# Patient Record
Sex: Male | Born: 1935
Health system: Southern US, Community
[De-identification: ages and names within clinical notes are randomized; demographics above are authoritative.]

## PROBLEM LIST (undated history)

## (undated) DIAGNOSIS — R0600 Dyspnea, unspecified: Secondary | ICD-10-CM

## (undated) DIAGNOSIS — T8859XA Other complications of anesthesia, initial encounter: Secondary | ICD-10-CM

## (undated) DIAGNOSIS — Z7901 Long term (current) use of anticoagulants: Secondary | ICD-10-CM

## (undated) DIAGNOSIS — T4145XA Adverse effect of unspecified anesthetic, initial encounter: Secondary | ICD-10-CM

## (undated) DIAGNOSIS — I4891 Unspecified atrial fibrillation: Secondary | ICD-10-CM

## (undated) DIAGNOSIS — I2510 Atherosclerotic heart disease of native coronary artery without angina pectoris: Secondary | ICD-10-CM

## (undated) DIAGNOSIS — N400 Enlarged prostate without lower urinary tract symptoms: Secondary | ICD-10-CM

## (undated) DIAGNOSIS — I10 Essential (primary) hypertension: Secondary | ICD-10-CM

## (undated) DIAGNOSIS — I499 Cardiac arrhythmia, unspecified: Secondary | ICD-10-CM

## (undated) DIAGNOSIS — M1990 Unspecified osteoarthritis, unspecified site: Secondary | ICD-10-CM

## (undated) DIAGNOSIS — E559 Vitamin D deficiency, unspecified: Secondary | ICD-10-CM

## (undated) DIAGNOSIS — R0609 Other forms of dyspnea: Secondary | ICD-10-CM

## (undated) DIAGNOSIS — Z5181 Encounter for therapeutic drug level monitoring: Secondary | ICD-10-CM

## (undated) DIAGNOSIS — E039 Hypothyroidism, unspecified: Secondary | ICD-10-CM

## (undated) DIAGNOSIS — R600 Localized edema: Secondary | ICD-10-CM

## (undated) HISTORY — PX: OTHER SURGICAL HISTORY: SHX169

## (undated) HISTORY — DX: Long term (current) use of anticoagulants: Z79.01

## (undated) HISTORY — DX: Unspecified osteoarthritis, unspecified site: M19.90

## (undated) HISTORY — DX: Benign prostatic hyperplasia without lower urinary tract symptoms: N40.0

## (undated) HISTORY — DX: Hypothyroidism, unspecified: E03.9

## (undated) HISTORY — DX: Other forms of dyspnea: R06.09

## (undated) HISTORY — DX: Essential (primary) hypertension: I10

## (undated) HISTORY — DX: Localized edema: R60.0

## (undated) HISTORY — PX: CORONARY ANGIOPLASTY: SHX604

## (undated) HISTORY — DX: Dyspnea, unspecified: R06.00

## (undated) HISTORY — DX: Atherosclerotic heart disease of native coronary artery without angina pectoris: I25.10

## (undated) HISTORY — DX: Encounter for therapeutic drug level monitoring: Z51.81

## (undated) HISTORY — PX: CHOLECYSTECTOMY: SHX55

## (undated) HISTORY — DX: Unspecified atrial fibrillation: I48.91

## (undated) HISTORY — DX: Vitamin D deficiency, unspecified: E55.9

---

## 1987-02-20 HISTORY — PX: OTHER SURGICAL HISTORY: SHX169

## 2001-02-19 HISTORY — PX: OTHER SURGICAL HISTORY: SHX169

## 2017-07-24 DIAGNOSIS — Z5181 Encounter for therapeutic drug level monitoring: Secondary | ICD-10-CM | POA: Diagnosis not present

## 2017-07-24 DIAGNOSIS — E039 Hypothyroidism, unspecified: Secondary | ICD-10-CM | POA: Diagnosis not present

## 2017-07-24 DIAGNOSIS — R600 Localized edema: Secondary | ICD-10-CM | POA: Diagnosis not present

## 2017-07-24 DIAGNOSIS — I10 Essential (primary) hypertension: Secondary | ICD-10-CM | POA: Diagnosis not present

## 2017-07-24 DIAGNOSIS — M1990 Unspecified osteoarthritis, unspecified site: Secondary | ICD-10-CM | POA: Diagnosis not present

## 2017-07-24 DIAGNOSIS — Z79899 Other long term (current) drug therapy: Secondary | ICD-10-CM | POA: Diagnosis not present

## 2017-07-24 DIAGNOSIS — I4891 Unspecified atrial fibrillation: Secondary | ICD-10-CM | POA: Diagnosis not present

## 2017-07-24 DIAGNOSIS — Z7901 Long term (current) use of anticoagulants: Secondary | ICD-10-CM | POA: Diagnosis not present

## 2017-07-24 DIAGNOSIS — I2510 Atherosclerotic heart disease of native coronary artery without angina pectoris: Secondary | ICD-10-CM | POA: Diagnosis not present

## 2017-07-24 DIAGNOSIS — R0609 Other forms of dyspnea: Secondary | ICD-10-CM | POA: Diagnosis not present

## 2017-08-19 DIAGNOSIS — Z7901 Long term (current) use of anticoagulants: Secondary | ICD-10-CM | POA: Diagnosis not present

## 2017-09-10 DIAGNOSIS — M5134 Other intervertebral disc degeneration, thoracic region: Secondary | ICD-10-CM | POA: Diagnosis not present

## 2017-09-10 DIAGNOSIS — M5136 Other intervertebral disc degeneration, lumbar region: Secondary | ICD-10-CM | POA: Diagnosis not present

## 2017-09-10 DIAGNOSIS — M9902 Segmental and somatic dysfunction of thoracic region: Secondary | ICD-10-CM | POA: Diagnosis not present

## 2017-09-10 DIAGNOSIS — M9903 Segmental and somatic dysfunction of lumbar region: Secondary | ICD-10-CM | POA: Diagnosis not present

## 2017-09-13 ENCOUNTER — Other Ambulatory Visit: Payer: Self-pay | Admitting: Nurse Practitioner

## 2017-09-13 ENCOUNTER — Ambulatory Visit
Admission: RE | Admit: 2017-09-13 | Discharge: 2017-09-13 | Disposition: A | Discharge status: Home / Self Care | Payer: Self-pay | Source: Ambulatory Visit | Attending: Nurse Practitioner | Admitting: Nurse Practitioner

## 2017-09-13 DIAGNOSIS — M25562 Pain in left knee: Secondary | ICD-10-CM

## 2017-09-13 DIAGNOSIS — M1712 Unilateral primary osteoarthritis, left knee: Secondary | ICD-10-CM | POA: Diagnosis not present

## 2017-09-18 DIAGNOSIS — M9902 Segmental and somatic dysfunction of thoracic region: Secondary | ICD-10-CM | POA: Diagnosis not present

## 2017-09-18 DIAGNOSIS — K219 Gastro-esophageal reflux disease without esophagitis: Secondary | ICD-10-CM | POA: Diagnosis not present

## 2017-09-18 DIAGNOSIS — M9903 Segmental and somatic dysfunction of lumbar region: Secondary | ICD-10-CM | POA: Diagnosis not present

## 2017-09-18 DIAGNOSIS — M5136 Other intervertebral disc degeneration, lumbar region: Secondary | ICD-10-CM | POA: Diagnosis not present

## 2017-09-18 DIAGNOSIS — Z7901 Long term (current) use of anticoagulants: Secondary | ICD-10-CM | POA: Diagnosis not present

## 2017-09-18 DIAGNOSIS — M5134 Other intervertebral disc degeneration, thoracic region: Secondary | ICD-10-CM | POA: Diagnosis not present

## 2017-09-18 DIAGNOSIS — M1990 Unspecified osteoarthritis, unspecified site: Secondary | ICD-10-CM | POA: Diagnosis not present

## 2017-09-18 DIAGNOSIS — R42 Dizziness and giddiness: Secondary | ICD-10-CM | POA: Diagnosis not present

## 2017-09-18 DIAGNOSIS — I4891 Unspecified atrial fibrillation: Secondary | ICD-10-CM | POA: Diagnosis not present

## 2017-09-23 ENCOUNTER — Ambulatory Visit (INDEPENDENT_AMBULATORY_CARE_PROVIDER_SITE_OTHER): Payer: Self-pay | Admitting: Physician Assistant

## 2017-09-25 DIAGNOSIS — M9902 Segmental and somatic dysfunction of thoracic region: Secondary | ICD-10-CM | POA: Diagnosis not present

## 2017-09-25 DIAGNOSIS — M5136 Other intervertebral disc degeneration, lumbar region: Secondary | ICD-10-CM | POA: Diagnosis not present

## 2017-09-25 DIAGNOSIS — M5134 Other intervertebral disc degeneration, thoracic region: Secondary | ICD-10-CM | POA: Diagnosis not present

## 2017-09-25 DIAGNOSIS — M9903 Segmental and somatic dysfunction of lumbar region: Secondary | ICD-10-CM | POA: Diagnosis not present

## 2017-10-01 ENCOUNTER — Ambulatory Visit (INDEPENDENT_AMBULATORY_CARE_PROVIDER_SITE_OTHER): Payer: Self-pay | Admitting: Physician Assistant

## 2017-10-02 ENCOUNTER — Ambulatory Visit (INDEPENDENT_AMBULATORY_CARE_PROVIDER_SITE_OTHER): Payer: Medicare HMO

## 2017-10-02 ENCOUNTER — Encounter (INDEPENDENT_AMBULATORY_CARE_PROVIDER_SITE_OTHER): Payer: Self-pay | Admitting: Physician Assistant

## 2017-10-02 ENCOUNTER — Ambulatory Visit (INDEPENDENT_AMBULATORY_CARE_PROVIDER_SITE_OTHER): Payer: Medicare HMO | Admitting: Physician Assistant

## 2017-10-02 DIAGNOSIS — M25561 Pain in right knee: Secondary | ICD-10-CM

## 2017-10-02 DIAGNOSIS — M170 Bilateral primary osteoarthritis of knee: Secondary | ICD-10-CM | POA: Diagnosis not present

## 2017-10-02 DIAGNOSIS — G8929 Other chronic pain: Secondary | ICD-10-CM | POA: Diagnosis not present

## 2017-10-02 NOTE — Progress Notes (Signed)
Office Visit Note   Patient: Danny Nelson           Date of Birth: 05-Mar-1929           MRN: [id number] Visit Date: 10/02/2017              Requested by: Marden NobleGates, Robert, MD [address] [place], [place] [address] PCP: Marden NobleGates, Robert, MD   Assessment & Plan: Visit Diagnoses:  1. Chronic pain of right knee   2. Primary osteoarthritis of both knees     Plan: Due to the fact the patient is tried conservative treatment which included injections and exercise and is continued to have pain with particularly his right knee affects his daily living recommend right total knee arthroplasty.  He wishes to proceed with this near future.  He does have A. fib and is on chronic Coumadin.  He is due to see his cardiologist on September 3 he will need cardiac clearance.  May need bridging for Coumadin.  Risk including but not limited to increased pain, prolonged pain, infection, blood loss, PE DVT, wound healing problems and nerve or vessel injury discussed with patient at length.  Knee model was shown to him.  Postop protocol reviewed with him.  Questions were encouraged and answered by Dr. Magnus IvanBlackman myself.  Follow-Up Instructions: Return for 2 weeks after surgery.   Orders:  Orders Placed This Encounter  Procedures  . XR Knee 1-2 Views Right   No orders of the defined types were placed in this encounter.     Procedures: No procedures performed   Clinical Data: No additional findings.   Subjective: Chief Complaint  Patient presents with  . Left Knee - Pain  . Right Knee - Pain    HPI [name] is a 84 year old male who comes in today with bilateral knee pain right greater than left.  He reports that year and a half ago while in [place] he had a cortisone injection in his right knee gave him some relief with it was temporary.  5 weeks ago he had a episode of the left knee started bothering that he can hardly walk.  He saw his primary care physician Dr. Kevan NyGates and gave him a  cortisone injection left knee and this helped for 3 to 4 days.  He states he is looked into supplemental injections for the knees and that his insurance will not cover it.  He is otherwise very active likes to walk play golf he is even been working on points of concrete over the last couple of days but is having severe knee pain with this.  He has had no particular injury to either knee.  He does have A. fib and is on Coumadin. Review of Systems Denies any fevers chills shortness of breath chest pain.  Objective: Vital Signs: There were no vitals taken for this visit.  Physical Exam  Constitutional: He is oriented to person, place, and time. He appears well-developed and well-nourished. No distress.  Cardiovascular: Intact distal pulses.  Pulmonary/Chest: Effort normal.  Neurological: He is alert and oriented to person, place, and time.  Skin: He is not diaphoretic.    Ortho Exam Bilateral knees no instability valgus varus stressing.  He has tenderness along medial joint line left greater than right knee.  No effusion abnormal warmth of either knee. Specialty Comments:  No specialty comments available.  Imaging: Xr Knee 1-2 Views Right  Result Date: 10/02/2017  Right knee: No acute fracture.  Bone-on-bone  medial compartment.  Mild lateral compartmental osteophytes.  Knee is well located.    PMFS History: There are no active problems to display for this patient.  History reviewed. No pertinent past medical history.  History reviewed. No pertinent family history.  History reviewed. No pertinent surgical history. Social History   Occupational History  . Not on file  Tobacco Use  . Smoking status: Not on file  Substance and Sexual Activity  . Alcohol use: Not on file  . Drug use: Not on file  . Sexual activity: Not on file

## 2017-10-09 ENCOUNTER — Telehealth (INDEPENDENT_AMBULATORY_CARE_PROVIDER_SITE_OTHER): Payer: Self-pay | Admitting: Orthopaedic Surgery

## 2017-10-09 MED ORDER — TRAMADOL HCL 50 MG PO TABS: 50.0000 mg | ORAL_TABLET | Freq: Four times a day (QID) | ORAL | 0 refills | Status: DC | PRN

## 2017-10-09 NOTE — Telephone Encounter (Signed)
Patient called requesting an RX refill on his Tramadol.  Patient uses [facility] on [address]  CB#[phone].  Thank you.

## 2017-10-09 NOTE — Telephone Encounter (Signed)
please advise.

## 2017-10-16 DIAGNOSIS — I4891 Unspecified atrial fibrillation: Secondary | ICD-10-CM | POA: Diagnosis not present

## 2017-10-16 DIAGNOSIS — M170 Bilateral primary osteoarthritis of knee: Secondary | ICD-10-CM | POA: Diagnosis not present

## 2017-10-16 DIAGNOSIS — Z7901 Long term (current) use of anticoagulants: Secondary | ICD-10-CM | POA: Diagnosis not present

## 2017-10-18 ENCOUNTER — Encounter: Payer: Self-pay | Admitting: Cardiology

## 2017-10-22 ENCOUNTER — Encounter: Payer: Self-pay | Admitting: Cardiology

## 2017-10-22 ENCOUNTER — Ambulatory Visit: Payer: Medicare HMO | Admitting: Cardiology

## 2017-10-22 VITALS — BP 112/70 | HR 76 | Ht 69.0 in | Wt 198.8 lb

## 2017-10-22 DIAGNOSIS — I482 Chronic atrial fibrillation, unspecified: Secondary | ICD-10-CM

## 2017-10-22 DIAGNOSIS — Z9861 Coronary angioplasty status: Secondary | ICD-10-CM | POA: Diagnosis not present

## 2017-10-22 DIAGNOSIS — I5030 Unspecified diastolic (congestive) heart failure: Secondary | ICD-10-CM | POA: Diagnosis not present

## 2017-10-22 DIAGNOSIS — R600 Localized edema: Secondary | ICD-10-CM | POA: Diagnosis not present

## 2017-10-22 DIAGNOSIS — I2510 Atherosclerotic heart disease of native coronary artery without angina pectoris: Secondary | ICD-10-CM

## 2017-10-22 DIAGNOSIS — Z01810 Encounter for preprocedural cardiovascular examination: Secondary | ICD-10-CM | POA: Diagnosis not present

## 2017-10-22 DIAGNOSIS — R011 Cardiac murmur, unspecified: Secondary | ICD-10-CM | POA: Diagnosis not present

## 2017-10-22 MED ORDER — FUROSEMIDE 40 MG PO TABS: 40.0000 mg | ORAL_TABLET | Freq: Every day | ORAL | 3 refills | Status: AC

## 2017-10-22 MED ORDER — LOSARTAN POTASSIUM 50 MG PO TABS: 50.0000 mg | ORAL_TABLET | Freq: Every day | ORAL | 3 refills | Status: DC

## 2017-10-22 MED ORDER — POTASSIUM CHLORIDE CRYS ER 20 MEQ PO TBCR: 20.0000 meq | EXTENDED_RELEASE_TABLET | Freq: Every day | ORAL | 3 refills | Status: AC

## 2017-10-22 NOTE — Patient Instructions (Addendum)
Medication Instructions:  Your physician has recommended you make the following change in your medication:   1. DECREASE  LOSARTAN TO 50 MG DAILY.  2. TAKE FUROSEMIDE TO 40 MG DAILY.  3. TAKE POTASSIUM 20 MG DAILY.  Labwork: LABS TOP BE DONE IN 7 DAYS: BMET  Testing/Procedures: Your physician has requested that you have a lexiscan myoview. For further information please visit [email or web address] Please follow instruction sheet, as given.  Your physician has requested that you have an echocardiogram. Echocardiography is a painless test that uses sound waves to create images of your heart. It provides your doctor with information about the size and shape of your heart and how well your heart's chambers and valves are working. This procedure takes approximately one hour. There are no restrictions for this procedure.    Follow-Up: Your physician recommends that you schedule a follow-up appointment WITH DR. Anne Fu OR APP WHEN DR. Anne Fu IS IN THE OFFICE AFTER TESTING IS COMPLETE.   Any Other Special Instructions Will Be Listed Below (If Applicable).     If you need a refill on your cardiac medications before your next appointment, please call your pharmacy.  Echocardiogram An echocardiogram, or echocardiography, uses sound waves (ultrasound) to produce an image of your heart. The echocardiogram is simple, painless, obtained within a short period of time, and offers valuable information to your health care provider. The images from an echocardiogram can provide information such as:  Evidence of coronary artery disease (CAD).  Heart size.  Heart muscle function.  Heart valve function.  Aneurysm detection.  Evidence of a past heart attack.  Fluid buildup around the heart.  Heart muscle thickening.  Assess heart valve function.  Tell a health care provider about:  Any allergies you have.  All medicines you are taking, including vitamins, herbs, eye drops, creams, and  over-the-counter medicines.  Any problems you or family members have had with anesthetic medicines.  Any blood disorders you have.  Any surgeries you have had.  Any medical conditions you have.  Whether you are pregnant or may be pregnant. What happens before the procedure? No special preparation is needed. Eat and drink normally. What happens during the procedure?  In order to produce an image of your heart, gel will be applied to your chest and a wand-like tool (transducer) will be moved over your chest. The gel will help transmit the sound waves from the transducer. The sound waves will harmlessly bounce off your heart to allow the heart images to be captured in real-time motion. These images will then be recorded.  You may need an IV to receive a medicine that improves the quality of the pictures. What happens after the procedure? You may return to your normal schedule including diet, activities, and medicines, unless your health care provider tells you otherwise. This information is not intended to replace advice given to you by your health care provider. Make sure you discuss any questions you have with your health care provider. Document Released: 02/03/2000 Document Revised: 09/24/2015 Document Reviewed: 10/13/2012 Elsevier Interactive Patient Education  2017 [employer].

## 2017-10-22 NOTE — Progress Notes (Signed)
10/22/2017 Danny Nelson   1929-07-02  [id number]  Primary Physician Marden Noble, MD Primary Cardiologist: New (Dr. Anne Fu, DOD) Previously Dr. Jolinda Croak, [facility]  Reason for Visit/CC: New Pt Evaluation for h/o CAD and Atrial fibrillation + preoperative risk assessment prior to elective right TKR.   HPI:  Danny Nelson is a 84 y.o. male who is being seen today to establish cardiovascular care as well as for preoperative risk assessment, prior to knee replacement, at the request of Marden Noble, MD.  Mr. Glasson has established cardiac history and has been treated at outside hospitals. This is his first time being seen by cardiology in the [facility]. Pre records faxed from PCP office [facility]), pt has h/o CAD and atrial fibrillation.  Apparently he has a history of coronary stent implantation performed in [place] at [facility] in 2009.  He has also been treated for atrial fibrillation at [facility] in [place] in 2011.  He is on chronic anticoagulation therapy with Coumadin and his INRs have been followed by his PCP Dr. Kevan Ny, since moving to [facility]. He is also on low dose amiodarone.  Additional history is notable for family history of congenital heart disease and valvular heart disease in brother and sister.  Unfortunately the exact details are unknown.  Additional past medical history includes osteoarthritis, GERD, BPH, hypothyroidism and hypertension.  Per PCP records, he was seen by Dr. Kevan Ny July 24, 2017 and complained of peripheral edema and worsening dyspnea on exertion over the course of several weeks.  He was started on Lasix which resulted in symptomatic improvement with his breathing as well as some reduction of edema, although this is still present.  He is only taking lasix 3 days a week, but recent labs showed stable renal function. He has also developed worsening knee osteoarthritis and was recently seen by his  orthopedic doctor.  He has failed conservative measures including medications for arthritis as well as knee injections.  The patient has decided to proceed with total knee replacement and will also need cardiac clearance prior to surgery.  He is here today with his daughter.  He tells me that he used to work as a IT sales professional which is why he was living in [place].  He lived there for 28 years.  He reports having a myocardial infarction in 2009 and had typical anginal symptoms including anterior chest pain and left arm pain.  He recalls having a cardiac catheterization at that time and received 2 stents however the vessel is unknown.  In 2011 he moved back to the states and was living in [place].  It was there when he was diagnosed with atrial fibrillation.  He recalls being followed by Dr. Cardell Peach, electrophysiologist.  He reports undergoing cardioversion as well as an attempted A. fib ablation however these measures failed.  He has since been on amiodarone therapy, low-dose at 100 mg daily.  As noted above he is on Coumadin which is followed by his PCP.  He recently moved to [place] for the cooler weather in April of this year.  He denies any recent cardiac symptoms including no chest pain or dyspnea, but at the same time has not been very active.  He denies palpitations but does note some exertional fatigue w/ ADLs.  He continues to have bilateral lower extremity edema on exam, 2+ pitting pretibial.  However he denies any orthopnea or PND.  He also has a murmur on exam, which  the patient is aware of.  We do not have any of his cardiac records available to [place] at time of this encounter.  Current Meds  Medication Sig  . amiodarone (PACERONE) 100 MG tablet Take 100 mg by mouth daily.  Marland Kitchen atorvastatin (LIPITOR) 20 MG tablet Take 20 mg by mouth daily.   . finasteride (PROSCAR) 5 MG tablet Take 5 mg by mouth daily.   Marland Kitchen levothyroxine (SYNTHROID, LEVOTHROID) 75 MCG tablet Take 75 mcg by mouth  daily before breakfast.   . metoprolol succinate (TOPROL-XL) 25 MG 24 hr tablet Take 1 tablet by mouth daily.  Marland Kitchen omeprazole (PRILOSEC) 40 MG capsule Take 40 mg by mouth daily before breakfast.   . traMADol (ULTRAM) 50 MG tablet Take 1-2 tablets (50-100 mg total) by mouth every 6 (six) hours as needed.  . traMADol-acetaminophen (ULTRACET) 37.5-325 MG tablet   . warfarin (COUMADIN) 3 MG tablet Take 3 mg by mouth 3 (three) times a week. Tuesday, Wednesday, and Friday  . [DISCONTINUED] furosemide (LASIX) 40 MG tablet Take 40 mg by mouth 3 (three) times a week. Monday, Wednesday, and Friday  . [DISCONTINUED] KLOR-CON M20 20 MEQ tablet Take 20 mEq by mouth 3 (three) times a week. Monday, Wednesday, and Friday  . [DISCONTINUED] losartan (COZAAR) 100 MG tablet Take 100 mg by mouth daily.    Allergies  Allergen Reactions  . Cocaine Hcl Other (See Comments)    VOMITING AND TACHYCARDIA  . Sudafed [Pseudoephedrine Hcl] Other (See Comments)    TACHYCARDIA   Past Medical History:  Diagnosis Date  . Anticoagulant long-term use   . Anticoagulation goal of INR 2 to 3   . Atrial fibrillation (HCC)   . BPH (benign prostatic hyperplasia)   . CAD (coronary artery disease)   . Dyspnea on exertion   . Hypertension   . Hypothyroidism   . Hypovitaminosis D   . Leg edema   . Osteoarthritis    No family history on file. No past surgical history on file. Social History   Socioeconomic History  . Marital status: Married    Spouse name: Not on file  . Number of children: 5  . Years of education: Not on file  . Highest education level: Not on file  Occupational History  . Not on file  Social Needs  . Financial resource strain: Not on file  . Food insecurity:    Worry: Not on file    Inability: Not on file  . Transportation needs:    Medical: Not on file    Non-medical: Not on file  Tobacco Use  . Smoking status: Never Smoker  . Smokeless tobacco: Never Used  Substance and Sexual Activity  .  Alcohol use: Never    Frequency: Never  . Drug use: Never  . Sexual activity: Not on file  Lifestyle  . Physical activity:    Days per week: Not on file    Minutes per session: Not on file  . Stress: Not on file  Relationships  . Social connections:    Talks on phone: Not on file    Gets together: Not on file    Attends religious service: Not on file    Active member of club or organization: Not on file    Attends meetings of clubs or organizations: Not on file    Relationship status: Not on file  . Intimate partner violence:    Fear of current or ex partner: Not on file    Emotionally abused:  Not on file    Physically abused: Not on file    Forced sexual activity: Not on file  Other Topics Concern  . Not on file  Social History Narrative  . Not on file     Review of Systems: General: negative for chills, fever, night sweats or weight changes.  Cardiovascular: negative for chest pain, dyspnea on exertion, edema, orthopnea, palpitations, paroxysmal nocturnal dyspnea or shortness of breath Dermatological: negative for rash Respiratory: negative for cough or wheezing Urologic: negative for hematuria Abdominal: negative for nausea, vomiting, diarrhea, bright red blood per rectum, melena, or hematemesis Neurologic: negative for visual changes, syncope, or dizziness All other systems reviewed and are otherwise negative except as noted above.   Physical Exam:  Blood pressure 112/70, pulse 76, height 5\' 9"  (1.753 m), weight 198 lb 12.8 oz (90.2 kg).  General appearance: elderly WM, alert, cooperative and no distress Neck: no carotid bruit and no JVD Lungs: clear to auscultation bilaterally Heart: irregularly irregular rhythm and 2/6 SM at RUSB Extremities: 2+ bilateral LEE pitting edema, pretibial Pulses: 2+ and symmetric Skin: Skin color, texture, turgor normal. No rashes or lesions Neurologic: Grossly normal  EKG atrial fibrillation w/ CVR 70s -- personally reviewed    ASSESSMENT AND PLAN:   1. CAD: Patient report he had a myocardial infarction while living in [place] in 2009 and received 2 coronary stents.  We do not have these records available to [place] for review.  He denies any repeat cardiac catheterization since 2009 but reports that he has had a stress test done several years ago while living in [place].  These records are also unavailable for [place] to review.  He denies any recent exertional chest pain or dyspnea however at the same time he is not very physically active.  Given the need for cardiovascular risk assessment prior to undergoing total knee replacement, we feel that it is best to have him undergo a nuclear stress test to rule out any high risk findings.  This was discussed with Dr. Anne Fu, Dr. of the day who agrees with stress testing.  2. Atrial Fibrillation: Pt reports first diagnosis was made in 2011.  It appears that he has undergone cardioversion as well as an attempted A. fib ablation while living in [place] several years ago.  Unfortunately these measures failed.  He has since been on low-dose amiodarone therapy as well as Coumadin for anticoagulation.  EKG obtained in our office today shows atrial fibrillation with controlled ventricular response.  We discussed discontinuation of amiodarone given the fact that he is in atrial fibrillation at current.  He does not appear to be on anything else for rate control.  After stress test and echocardiogram are complete, we will have the patient follow-up and will get a repeat EKG and if he continues to be in atrial fibrillation then we should likely discontinue amiodarone altogether given the risk of long-term use, as there does not appear to be much benefit with continuation at this time.  His PCP Dr. Kevan Ny will continue to manage his Coumadin.  3. Chronic Anticoagulation: He is on Coumadin for stroke prophylaxis in the setting of atrial fibrillation.  INRs are followed by his PCP.  We  discussed other options such as Eliquis and Xarelto however the patient reports that these medications have been too expensive to afford in the past.  He has new insurance and is curious if he would now qualify for a lower co-pay.  We will have a representative from  our office check his insurance to determine cost of NOACs.   4. HTN: Controlled. current regimen.  A bit soft however at 112/70.  We will decrease losartan dose down from 100 mg daily to 50 mg daily to allow further titration of diuretics for his lower extremity edema.  Follow-up BMPt in 7 days after increasing Lasix to daily.  5. Murmur:  Noted on exam.  2/6 systolic murmur at right upper sternal border.  Will obtain 2D echo to further assess.  6. HLD: followed by PCP.  On statin therapy.  9. Preoperative CV Assessment: given his reported history and lack of details currently available to [place], we feel best to have the patient undergo a nuclear stress test to rule out possibility of any high risk findings suggestive of ischemia as well as a 2D echocardiogram to assess left ventricular EF and valvular function, given notable bilateral lower extremity on exam, history of chronic atrial fibrillation as well as murmur detected on examination.  Once we have this information, we will be able to better assess his perioperative risk.  Clearance to be determined based on further work-up.  I have discussed patient case with Dr. Anne Fu, Dr. of the day, who agrees with the above assessment and plan.   Follow-Up after stress test and echo are completed.   Jazalynn Mireles Delmer Islam, MHS [facility] 10/22/2017 5:20 PM

## 2017-10-23 ENCOUNTER — Telehealth (HOSPITAL_COMMUNITY): Payer: Self-pay | Admitting: *Deleted

## 2017-10-23 NOTE — Telephone Encounter (Signed)
Patient given detailed instructions per Myocardial Perfusion Study Information Sheet for the test on 9/919 Patient notified to arrive 15 minutes early and that it is imperative to arrive on time for appointment to keep from having the test rescheduled.  If you need to cancel or reschedule your appointment, please call the office within 24 hours of your appointment. . Patient verbalized understanding Hillary Schwegler Jacqueline    

## 2017-10-28 ENCOUNTER — Ambulatory Visit (HOSPITAL_COMMUNITY): Payer: Medicare HMO | Attending: Internal Medicine

## 2017-10-28 ENCOUNTER — Other Ambulatory Visit: Payer: Medicare HMO | Admitting: *Deleted

## 2017-10-28 ENCOUNTER — Other Ambulatory Visit: Payer: Self-pay

## 2017-10-28 ENCOUNTER — Ambulatory Visit (HOSPITAL_BASED_OUTPATIENT_CLINIC_OR_DEPARTMENT_OTHER): Payer: Medicare HMO

## 2017-10-28 VITALS — Ht 69.0 in | Wt 199.0 lb

## 2017-10-28 DIAGNOSIS — R600 Localized edema: Secondary | ICD-10-CM

## 2017-10-28 DIAGNOSIS — I272 Pulmonary hypertension, unspecified: Secondary | ICD-10-CM | POA: Diagnosis not present

## 2017-10-28 DIAGNOSIS — Z01810 Encounter for preprocedural cardiovascular examination: Secondary | ICD-10-CM

## 2017-10-28 DIAGNOSIS — I252 Old myocardial infarction: Secondary | ICD-10-CM | POA: Diagnosis not present

## 2017-10-28 DIAGNOSIS — R011 Cardiac murmur, unspecified: Secondary | ICD-10-CM | POA: Diagnosis not present

## 2017-10-28 DIAGNOSIS — I2510 Atherosclerotic heart disease of native coronary artery without angina pectoris: Secondary | ICD-10-CM | POA: Diagnosis not present

## 2017-10-28 DIAGNOSIS — I351 Nonrheumatic aortic (valve) insufficiency: Secondary | ICD-10-CM | POA: Diagnosis not present

## 2017-10-28 DIAGNOSIS — I5030 Unspecified diastolic (congestive) heart failure: Secondary | ICD-10-CM

## 2017-10-28 DIAGNOSIS — I482 Chronic atrial fibrillation, unspecified: Secondary | ICD-10-CM

## 2017-10-28 DIAGNOSIS — I4891 Unspecified atrial fibrillation: Secondary | ICD-10-CM | POA: Insufficient documentation

## 2017-10-28 DIAGNOSIS — I509 Heart failure, unspecified: Secondary | ICD-10-CM | POA: Insufficient documentation

## 2017-10-28 DIAGNOSIS — I110 Hypertensive heart disease with heart failure: Secondary | ICD-10-CM | POA: Diagnosis not present

## 2017-10-28 LAB — BASIC METABOLIC PANEL
BUN / CREAT RATIO: 15 (ref 10–24)
BUN: 17 mg/dL (ref 8–27)
CO2: 25 mmol/L (ref 20–29)
CREATININE: 1.17 mg/dL (ref 0.76–1.27)
Calcium: 8.8 mg/dL (ref 8.6–10.2)
Chloride: 104 mmol/L (ref 96–106)
GFR calc Af Amer: 65 mL/min/{1.73_m2} (ref >59)
GFR, EST NON AFRICAN AMERICAN: 57 mL/min/{1.73_m2} — AB (ref >59)
GLUCOSE: 91 mg/dL (ref 65–99)
Potassium: 4.2 mmol/L (ref 3.5–5.2)
SODIUM: 143 mmol/L (ref 134–144)

## 2017-10-28 LAB — MYOCARDIAL PERFUSION IMAGING
CHL CUP NUCLEAR SDS: 0
CHL CUP NUCLEAR SRS: 0
CSEPPHR: 85 {beats}/min
LV dias vol: 89 mL (ref 62–150)
LV sys vol: 42 mL
Rest HR: 77 {beats}/min
SSS: 0
TID: 0.89

## 2017-10-28 MED ORDER — TECHNETIUM TC 99M TETROFOSMIN IV KIT
32.9000 | PACK | Freq: Once | INTRAVENOUS | Status: AC | PRN
  Administered 2017-10-28: 32.9 via INTRAVENOUS
  Filled 2017-10-28: qty 33

## 2017-10-28 MED ORDER — REGADENOSON 0.4 MG/5ML IV SOLN
0.4000 mg | Freq: Once | INTRAVENOUS | Status: AC
  Administered 2017-10-28: 0.4 mg via INTRAVENOUS

## 2017-10-28 MED ORDER — TECHNETIUM TC 99M TETROFOSMIN IV KIT
10.4000 | PACK | Freq: Once | INTRAVENOUS | Status: AC | PRN
  Administered 2017-10-28: 10.4 via INTRAVENOUS
  Filled 2017-10-28: qty 11

## 2017-11-05 ENCOUNTER — Encounter: Payer: Self-pay | Admitting: Cardiology

## 2017-11-05 ENCOUNTER — Ambulatory Visit: Payer: Medicare HMO | Admitting: Cardiology

## 2017-11-05 VITALS — BP 128/72 | HR 83 | Ht 69.0 in | Wt 194.4 lb

## 2017-11-05 DIAGNOSIS — I482 Chronic atrial fibrillation, unspecified: Secondary | ICD-10-CM

## 2017-11-05 DIAGNOSIS — I2510 Atherosclerotic heart disease of native coronary artery without angina pectoris: Secondary | ICD-10-CM

## 2017-11-05 DIAGNOSIS — Z9861 Coronary angioplasty status: Secondary | ICD-10-CM | POA: Diagnosis not present

## 2017-11-05 DIAGNOSIS — R600 Localized edema: Secondary | ICD-10-CM

## 2017-11-05 NOTE — Patient Instructions (Signed)
Medication Instructions:  The current medical regimen is effective;  continue present plan and medications.  Follow-Up: Follow up with Boyce MediciBrittany Simmons in 2 months.  If you need a refill on your cardiac medications before your next appointment, please call your pharmacy.  Thank you for choosing [facility]!!

## 2017-11-05 NOTE — Progress Notes (Signed)
Cardiology Office Note:    Date:  11/05/2017   ID:  Danny Nelson, DOB 14-Oct-1929, MRN [id number]  PCP:  Danny Noble, MD  Cardiologist:  No primary care provider on file.  Electrophysiologist:  None   Referring MD: Danny Noble, MD     History of Present Illness:    Danny Nelson is a 84 y.o. male here for preoperative cardiac risk evaluation prior to left knee surgery.  His stress test on 10/28/2017 was low risk with no ischemia EF 52% Echo:- The patient was in atrial fibrillation. Normal LV size with mild   focal basal septal hypertrophy. EF 55%. Normal RV size and   systolic function. Aortic valve sclerosis without significant   stenosis. Mild aortic insufficiency. Mild pulmonary hypertension.  He saw [place] in evaluation on 10/22/2017.  Review of her note, his establish cardiac history has been treated at outside hospitals, has history of CAD and atrial fibrillation.  Had stent in [place] in 2009, was treated for atrial fibrillation in [place] in 2011, he is on chronic anticoagulation with Coumadin followed by Dr. Kevan Ny.  He is also on low-dose amiodarone.  He had cardioversion as well as attempted atrial fibrillation ablation but these measures failed.  He had been on amiodarone therapy, 100 mg a day since then.  No recent chest pain, no recent dyspnea not very active.  No significant palpitations has bilateral lower extremity edema 2+.  Murmur noted.  Currently doing quite well.  He did bang his left lower leg on an air conditioning unit.  He has a fluid-filled structure surrounding that leg.  No signs of infection.  Likely accumulation focal of his edema.  Could have been partial hematoma as well with his Coumadin.    Past Medical History:  Diagnosis Date  . Anticoagulant long-term use   . Anticoagulation goal of INR 2 to 3   . Atrial fibrillation (HCC)   . BPH (benign prostatic hyperplasia)   . CAD (coronary artery disease)   . Dyspnea on exertion     . Hypertension   . Hypothyroidism   . Hypovitaminosis D   . Leg edema   . Osteoarthritis     History reviewed. No pertinent surgical history.  Current Medications: Current Meds  Medication Sig  . amiodarone (PACERONE) 100 MG tablet Take 100 mg by mouth daily.  Marland Kitchen atorvastatin (LIPITOR) 20 MG tablet Take 20 mg by mouth daily.   . finasteride (PROSCAR) 5 MG tablet Take 5 mg by mouth daily.   . furosemide (LASIX) 40 MG tablet Take 1 tablet (40 mg total) by mouth daily.  Marland Kitchen levothyroxine (SYNTHROID, LEVOTHROID) 75 MCG tablet Take 75 mcg by mouth daily before breakfast.   . losartan (COZAAR) 50 MG tablet Take 1 tablet (50 mg total) by mouth daily.  . metoprolol succinate (TOPROL-XL) 25 MG 24 hr tablet Take 1 tablet by mouth daily.  Marland Kitchen omeprazole (PRILOSEC) 40 MG capsule Take 40 mg by mouth daily before breakfast.   . potassium chloride SA (K-DUR,KLOR-CON) 20 MEQ tablet Take 1 tablet (20 mEq total) by mouth daily.  . traMADol (ULTRAM) 50 MG tablet Take 1-2 tablets (50-100 mg total) by mouth every 6 (six) hours as needed.  . traMADol-acetaminophen (ULTRACET) 37.5-325 MG tablet   . warfarin (COUMADIN) 3 MG tablet Take 3 mg by mouth 3 (three) times a week. Tuesday, Wednesday, and Friday     Allergies:   Cocaine hcl and Sudafed [pseudoephedrine hcl]   Social History   Socioeconomic  History  . Marital status: Married    Spouse name: Not on file  . Number of children: 5  . Years of education: Not on file  . Highest education level: Not on file  Occupational History  . Not on file  Social Needs  . Financial resource strain: Not on file  . Food insecurity:    Worry: Not on file    Inability: Not on file  . Transportation needs:    Medical: Not on file    Non-medical: Not on file  Tobacco Use  . Smoking status: Never Smoker  . Smokeless tobacco: Never Used  Substance and Sexual Activity  . Alcohol use: Never    Frequency: Never  . Drug use: Never  . Sexual activity: Not on file   Lifestyle  . Physical activity:    Days per week: Not on file    Minutes per session: Not on file  . Stress: Not on file  Relationships  . Social connections:    Talks on phone: Not on file    Gets together: Not on file    Attends religious service: Not on file    Active member of club or organization: Not on file    Attends meetings of clubs or organizations: Not on file    Relationship status: Not on file  Other Topics Concern  . Not on file  Social History Narrative  . Not on file     Family History: The patient's his brother died of heart failure at age 82.  ROS:   Please see the history of present illness.     All other systems reviewed and are negative.  EKGs/Labs/Other Studies Reviewed:    The following studies were reviewed today: Prior office notes, EKG, nuclear stress test, echocardiogram personally reviewed  EKG: Atrial fibrillation heart rates in the 70s personally reviewed.  Recent Labs: 10/28/2017: BUN 17; Creatinine, Ser 1.17; Potassium 4.2; Sodium 143  Recent Lipid Panel No results found for: CHOL, TRIG, HDL, CHOLHDL, VLDL, LDLCALC, LDLDIRECT  Physical Exam:    VS:  BP 128/72   Pulse 83   Ht 5\' 9"  (1.753 m)   Wt 194 lb 6.4 oz (88.2 kg)   SpO2 97%   BMI 28.71 kg/m     Wt Readings from Last 3 Encounters:  11/05/17 194 lb 6.4 oz (88.2 kg)  10/28/17 199 lb (90.3 kg)  10/22/17 198 lb 12.8 oz (90.2 kg)     GEN: Elderly but strong, well-appearing, well developed in no acute distress HEENT: Normal NECK: No JVD; No carotid bruits LYMPHATICS: No lymphadenopathy CARDIAC: Irreg normal rate, 2/6 SM RUSB no rubs, gallops RESPIRATORY:  Clear to auscultation without rales, wheezing or rhonchi  ABDOMEN: Soft, non-tender, non-distended MUSCULOSKELETAL:  1+ BLL edema; Left lower extremity pretibial soft bulge, fluid-filled structure where he hit his leg under trauma.  Not hard. SKIN: Warm and dry NEUROLOGIC:  Alert and oriented x 3 PSYCHIATRIC:  Normal  affect   ASSESSMENT:    1. Chronic atrial fibrillation (HCC)   2. CAD S/P percutaneous coronary angioplasty   3. Lower extremity edema    PLAN:    In order of problems listed above:  Preoperative cardiac risk stratification -Nuclear stress test was reassuring, low risk with no evidence of ischemia and normal EF.  Because of this, he may proceed with his knee surgery with moderate overall cardiac risk based upon his age and prior CAD history.  He may hold his Coumadin for 4 days prior  to procedure.  He is desiring to have both legs done at the same time.  This is fine with me but this would need to be cleared with Dr. Magnus Ivan who has the ultimate say.  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  Stable.  No anginal symptoms.  Continue with aggressive secondary prevention.  Permanent atrial fibrillation - Both atrial fibrillation ablation and cardioversion were attempted in [place] but failed.  He had been on low-dose amiodarone therapy since with Coumadin.  He is well rate controlled.  Currently on Toprol-XL 25 mill grams a day.  It would not be unreasonable to discontinue his 100 mg of amiodarone.  For now however given that he is going to undergo surgery, let us keep the low-dose amiodarone on board until we see him postoperatively.  I will set an appointment up for 2 months with [place].  At that time I am comfortable with him discontinuing his amiodarone.  If his heart rate does elevate surrounding surgery, we can always increase his metoprolol. -I have also asked him to inquire about the possibility of Eliquis.  To call his insurance company to ask about cost.  Essential hypertension - 128/72 today.  No changes made.  Aortic sclerosis with no stenosis, mild aortic regurgitation -Murmur heard on exam.  Hyperlipidemia - Continuing with atorvastatin.  LDL 55.  Medication Adjustments/Labs and Tests  Ordered: Current medicines are reviewed at length with the patient today.  Concerns regarding medicines are outlined above.  No orders of the defined types were placed in this encounter.  No orders of the defined types were placed in this encounter.   Patient Instructions  Medication Instructions:  The current medical regimen is effective;  continue present plan and medications.  Follow-Up: Follow up with Boyce Medici in 2 months.  If you need a refill on your cardiac medications before your next appointment, please call your pharmacy.  Thank you for choosing [facility]!!        Signed, Donato Schultz, MD  11/05/2017 10:33 AM    [facility]

## 2017-11-11 ENCOUNTER — Encounter (INDEPENDENT_AMBULATORY_CARE_PROVIDER_SITE_OTHER): Payer: Self-pay | Admitting: Orthopaedic Surgery

## 2017-11-11 ENCOUNTER — Ambulatory Visit (INDEPENDENT_AMBULATORY_CARE_PROVIDER_SITE_OTHER): Payer: Medicare HMO | Admitting: Orthopaedic Surgery

## 2017-11-11 ENCOUNTER — Telehealth: Payer: Self-pay | Admitting: Cardiology

## 2017-11-11 DIAGNOSIS — Z79899 Other long term (current) drug therapy: Secondary | ICD-10-CM

## 2017-11-11 DIAGNOSIS — M1711 Unilateral primary osteoarthritis, right knee: Secondary | ICD-10-CM | POA: Diagnosis not present

## 2017-11-11 DIAGNOSIS — M25562 Pain in left knee: Secondary | ICD-10-CM

## 2017-11-11 DIAGNOSIS — M25561 Pain in right knee: Secondary | ICD-10-CM | POA: Diagnosis not present

## 2017-11-11 DIAGNOSIS — M1712 Unilateral primary osteoarthritis, left knee: Secondary | ICD-10-CM | POA: Diagnosis not present

## 2017-11-11 DIAGNOSIS — G8929 Other chronic pain: Secondary | ICD-10-CM

## 2017-11-11 DIAGNOSIS — I4821 Permanent atrial fibrillation: Secondary | ICD-10-CM

## 2017-11-11 NOTE — Telephone Encounter (Signed)
New Message:     Patient stated she was told to start taking Eliquis and is waiting on the RX.

## 2017-11-11 NOTE — Progress Notes (Signed)
Office Visit Note   Patient: Danny Nelson           Date of Birth: Jul 31, 1929           MRN: [id number] Visit Date: 11/11/2017              Requested by: Marden Noble, MD [address] [place], [place] [address] PCP: Marden Noble, MD   Assessment & Plan: Visit Diagnoses:  1. Unilateral primary osteoarthritis, right knee   2. Unilateral primary osteoarthritis, left knee   3. Chronic pain of left knee   4. Chronic pain of right knee     Plan: I showed him a knee model and went over knee replacement surgery in detail.  He understands again that we only want to pursue this on the right knee and I would provide a steroid injection his left knee at the time of surgery but his right knee is the one is severely arthritic.  Of note he did have a mass on his left ankle that I felt would be a hematoma after he had trauma to this ankle recently he is on blood thinners.  I did aspirate fluid from this area to decompress it in a place Ace wrap around without difficulty.  Once again we will set him up for a right total knee arthroplasty.  He will stop blood thinning medications 4 days preop.  All question concerns were answered and addressed.  Postoperatively we will send him to skilled nursing for rehabilitation which is his wishes as well.  Follow-Up Instructions: Return for 2 weeks post-op.   Orders:  No orders of the defined types were placed in this encounter.  No orders of the defined types were placed in this encounter.     Procedures: No procedures performed   Clinical Data: No additional findings.   Subjective: Chief Complaint  Patient presents with  . Left Knee - Pain  . Right Knee - Pain  Patient comes in today to talk about knee replacement surgery for both his knees.  However it is right knee that is most severe in the right knee has the most severe arthritis.  The left knee although it hurts does not have significant arthritis.  He is on blood thinning  medications been cleared for surgery by his cardiologist.  He has been on Coumadin but they are going to switch him to Eliquis soon.  His right knee is once more severely painful.  We had a long thorough discussion with him understanding that I would not perform bilateral knee replacements on him given his age and also the fact though that I think that his left knee does not need to be replaced at all.  HPI  Review of Systems He currently denies any headache, chest pain, shortness of breath, fever, chills, nausea, vomiting.  Objective: Vital Signs: There were no vitals taken for this visit.  Physical Exam Is alert and oriented x3 and in no acute distress Ortho Exam Examination of his right knee shows varus malalignment with significant patellofemoral crepitation and medial joint line tenderness.  Left knee no shows no effusion and no malalignment with good range of motion with global pain. Specialty Comments:  No specialty comments available.  Imaging: No results found. X-rays of both knees from earlier this year reviewed with him shows significant and severe arthritis of the right knee but only mild arthritis of the left knee.  PMFS History: There are no active problems to display for this  patient.  Past Medical History:  Diagnosis Date  . Anticoagulant long-term use   . Anticoagulation goal of INR 2 to 3   . Atrial fibrillation (HCC)   . BPH (benign prostatic hyperplasia)   . CAD (coronary artery disease)   . Dyspnea on exertion   . Hypertension   . Hypothyroidism   . Hypovitaminosis D   . Leg edema   . Osteoarthritis     No family history on file.  No past surgical history on file. Social History   Occupational History  . Not on file  Tobacco Use  . Smoking status: Never Smoker  . Smokeless tobacco: Never Used  Substance and Sexual Activity  . Alcohol use: Never    Frequency: Never  . Drug use: Never  . Sexual activity: Not on file

## 2017-11-11 NOTE — Telephone Encounter (Signed)
The patient called and said that he checked with his insurance company and he is willing to go ahead with Eliquis.  He has a reasonable co-pay so please advise on dosage.  Thank you

## 2017-11-13 NOTE — Telephone Encounter (Signed)
Start Eliquis 5 mg p.o. twice daily Stop Coumadin when starting Eliquis. Excellent, thanks Check CBC and BMET in one month.   Donato Schultz, MD

## 2017-11-14 MED ORDER — APIXABAN 5 MG PO TABS: 5.0000 mg | ORAL_TABLET | Freq: Two times a day (BID) | ORAL | 3 refills | Status: DC

## 2017-11-14 NOTE — Telephone Encounter (Signed)
Called patient with Dr. Anne Fu recommendations. Patient will start Eliquis 5 mg twice daily and Stop Coumadin. Patient will come in for lab work on 12/11/17, patient stated his surgery is scheduled for 12/13/17, so will have his lab work done before hand. Patient verbalized understanding.

## 2017-11-15 DIAGNOSIS — R69 Illness, unspecified: Secondary | ICD-10-CM | POA: Diagnosis not present

## 2017-11-21 DIAGNOSIS — Z7901 Long term (current) use of anticoagulants: Secondary | ICD-10-CM | POA: Diagnosis not present

## 2017-11-21 DIAGNOSIS — I4891 Unspecified atrial fibrillation: Secondary | ICD-10-CM | POA: Diagnosis not present

## 2017-11-21 DIAGNOSIS — K219 Gastro-esophageal reflux disease without esophagitis: Secondary | ICD-10-CM | POA: Diagnosis not present

## 2017-11-21 DIAGNOSIS — M170 Bilateral primary osteoarthritis of knee: Secondary | ICD-10-CM | POA: Diagnosis not present

## 2017-11-27 ENCOUNTER — Telehealth (INDEPENDENT_AMBULATORY_CARE_PROVIDER_SITE_OTHER): Payer: Self-pay | Admitting: Orthopaedic Surgery

## 2017-11-27 NOTE — Telephone Encounter (Signed)
They will do that in the hospital through a case worker

## 2017-11-27 NOTE — Telephone Encounter (Signed)
Patient called advised he has set up (PT) at [facility] and he will be going there on the Monday 12/16/17. Patient said Sheliah Hatch will need a call from Dr Magnus Ivan confirming that he had surgery and need to go to rehab. The number to contact patient is [phone]

## 2017-11-27 NOTE — Telephone Encounter (Signed)
Spoke with patient advised case worker will arrange for (PT) from hospital

## 2017-11-29 ENCOUNTER — Telehealth (INDEPENDENT_AMBULATORY_CARE_PROVIDER_SITE_OTHER): Payer: Self-pay

## 2017-11-29 MED ORDER — TRAMADOL HCL 50 MG PO TABS: 50.0000 mg | ORAL_TABLET | Freq: Four times a day (QID) | ORAL | 0 refills | Status: DC | PRN

## 2017-11-29 NOTE — Telephone Encounter (Signed)
Patient has decided to postpone Right TKA from 12/13/17 to Jan. 2020 due to a family matter.  He wants to see if Dr. Magnus Ivan will be able to help manage his pain in the interim.  He states he currently has a Tramadol Rx.  [phone]

## 2017-11-29 NOTE — Telephone Encounter (Signed)
He can be seen in the office for a steroid injection.

## 2017-11-29 NOTE — Telephone Encounter (Signed)
Please advise 

## 2017-11-29 NOTE — Telephone Encounter (Signed)
I did send him in some Tramadol.

## 2017-11-29 NOTE — Telephone Encounter (Signed)
They are wondering if he can have maybe a cortisone injection instead of a narcotic pain medication?

## 2017-12-02 NOTE — Telephone Encounter (Signed)
Can we get him in for a steroid injection with Bronson Curb or Magnus Ivan please Thank you

## 2017-12-02 NOTE — Telephone Encounter (Signed)
Thank you margaret! 

## 2017-12-02 NOTE — Telephone Encounter (Signed)
Patient is scheduled for steroid injection 12/10/17 at 10:15am

## 2017-12-10 ENCOUNTER — Encounter (INDEPENDENT_AMBULATORY_CARE_PROVIDER_SITE_OTHER): Payer: Self-pay | Admitting: Orthopaedic Surgery

## 2017-12-10 ENCOUNTER — Ambulatory Visit (INDEPENDENT_AMBULATORY_CARE_PROVIDER_SITE_OTHER): Payer: Medicare HMO | Admitting: Orthopaedic Surgery

## 2017-12-10 DIAGNOSIS — M1712 Unilateral primary osteoarthritis, left knee: Secondary | ICD-10-CM | POA: Diagnosis not present

## 2017-12-10 DIAGNOSIS — M9903 Segmental and somatic dysfunction of lumbar region: Secondary | ICD-10-CM | POA: Diagnosis not present

## 2017-12-10 DIAGNOSIS — M5136 Other intervertebral disc degeneration, lumbar region: Secondary | ICD-10-CM | POA: Diagnosis not present

## 2017-12-10 DIAGNOSIS — M9902 Segmental and somatic dysfunction of thoracic region: Secondary | ICD-10-CM | POA: Diagnosis not present

## 2017-12-10 DIAGNOSIS — M5134 Other intervertebral disc degeneration, thoracic region: Secondary | ICD-10-CM | POA: Diagnosis not present

## 2017-12-10 MED ORDER — METHYLPREDNISOLONE ACETATE 40 MG/ML IJ SUSP
40.0000 mg | INTRAMUSCULAR | Status: AC | PRN
  Administered 2017-12-10: 40 mg via INTRA_ARTICULAR

## 2017-12-10 MED ORDER — LIDOCAINE HCL 1 % IJ SOLN
3.0000 mL | INTRAMUSCULAR | Status: AC | PRN
  Administered 2017-12-10: 3 mL

## 2017-12-10 NOTE — Progress Notes (Signed)
   Procedure Note  Patient: Danny Nelson             Date of Birth: [birth year]           MRN: [id number]             Visit Date: 12/10/2017 HPI: Mr. Judice is well-known to Dr. Magnus Ivan service comes in today wanting to postpone his right total knee surgery until the first part of January wondering if he can have an injection in the right or left knee.  He has known osteoarthritis of both knees.  He also is requesting a re-Pete decompression fluid-filled cyst about his ankle.   Procedures: Visit Diagnoses: No diagnosis found.  Large Joint Inj: L knee on 12/10/2017 11:22 AM Indications: pain Details: 22 G 1.5 in needle, anterolateral approach  Arthrogram: No  Medications: 3 mL lidocaine 1 %; 40 mg methylPREDNISolone acetate 40 MG/ML Outcome: tolerated well, no immediate complications Procedure, treatment alternatives, risks and benefits explained, specific risks discussed. Consent was given by the patient. Immediately prior to procedure a time out was called to verify the correct patient, procedure, equipment, support staff and site/side marked as required. Patient was prepped and draped in the usual sterile fashion.     Plan: I explained to [name] that if he is wanting a right knee replaced and early January would not recommend an injection of the knee today.  However we could inject the left knee he is agreeable with this.  The left leg cyst was aspirated today 4 cc of yellow non-gelatinous fluid was aspirated after prepped with Betadine patient tolerated well.  We will see him back 2 weeks postop.

## 2017-12-11 ENCOUNTER — Other Ambulatory Visit: Payer: Medicare HMO | Admitting: *Deleted

## 2017-12-11 DIAGNOSIS — I4821 Permanent atrial fibrillation: Secondary | ICD-10-CM | POA: Diagnosis not present

## 2017-12-11 DIAGNOSIS — Z79899 Other long term (current) drug therapy: Secondary | ICD-10-CM | POA: Diagnosis not present

## 2017-12-11 LAB — CBC WITH DIFFERENTIAL/PLATELET
BASOS: 0 %
Basophils Absolute: 0 10*3/uL (ref 0.0–0.2)
EOS (ABSOLUTE): 0.1 10*3/uL (ref 0.0–0.4)
EOS: 1 %
HEMATOCRIT: 35.3 % — AB (ref 37.5–51.0)
Hemoglobin: 11.7 g/dL — ABNORMAL LOW (ref 13.0–17.7)
IMMATURE GRANS (ABS): 0 10*3/uL (ref 0.0–0.1)
IMMATURE GRANULOCYTES: 0 %
LYMPHS: 22 %
Lymphocytes Absolute: 2 10*3/uL (ref 0.7–3.1)
MCH: 28.8 pg (ref 26.6–33.0)
MCHC: 33.1 g/dL (ref 31.5–35.7)
MCV: 87 fL (ref 79–97)
Monocytes Absolute: 1.1 10*3/uL — ABNORMAL HIGH (ref 0.1–0.9)
Monocytes: 12 %
NEUTROS PCT: 65 %
Neutrophils Absolute: 6.1 10*3/uL (ref 1.4–7.0)
PLATELETS: 288 10*3/uL (ref 150–450)
RBC: 4.06 x10E6/uL — ABNORMAL LOW (ref 4.14–5.80)
RDW: 17.1 % — ABNORMAL HIGH (ref 12.3–15.4)
WBC: 9.3 10*3/uL (ref 3.4–10.8)

## 2017-12-11 LAB — BASIC METABOLIC PANEL
BUN/Creatinine Ratio: 16 (ref 10–24)
BUN: 21 mg/dL (ref 8–27)
CALCIUM: 9.2 mg/dL (ref 8.6–10.2)
CO2: 24 mmol/L (ref 20–29)
CREATININE: 1.32 mg/dL — AB (ref 0.76–1.27)
Chloride: 104 mmol/L (ref 96–106)
GFR, EST AFRICAN AMERICAN: 56 mL/min/{1.73_m2} — AB (ref >59)
GFR, EST NON AFRICAN AMERICAN: 49 mL/min/{1.73_m2} — AB (ref >59)
Glucose: 75 mg/dL (ref 65–99)
Potassium: 3.8 mmol/L (ref 3.5–5.2)
SODIUM: 142 mmol/L (ref 134–144)

## 2017-12-13 ENCOUNTER — Ambulatory Visit: Admit: 2017-12-13 | Payer: Medicare HMO | Admitting: Orthopaedic Surgery

## 2017-12-13 SURGERY — ARTHROPLASTY, KNEE, TOTAL
Anesthesia: Spinal | Site: Knee | Laterality: Right

## 2017-12-26 ENCOUNTER — Inpatient Hospital Stay (INDEPENDENT_AMBULATORY_CARE_PROVIDER_SITE_OTHER): Payer: Medicare HMO | Admitting: Orthopaedic Surgery

## 2018-01-09 ENCOUNTER — Ambulatory Visit: Payer: Medicare HMO | Admitting: Cardiology

## 2018-01-09 ENCOUNTER — Encounter: Payer: Self-pay | Admitting: Cardiology

## 2018-01-09 ENCOUNTER — Encounter (INDEPENDENT_AMBULATORY_CARE_PROVIDER_SITE_OTHER): Payer: Self-pay

## 2018-01-09 VITALS — BP 118/60 | HR 72 | Ht 69.0 in | Wt 199.1 lb

## 2018-01-09 DIAGNOSIS — I2510 Atherosclerotic heart disease of native coronary artery without angina pectoris: Secondary | ICD-10-CM | POA: Diagnosis not present

## 2018-01-09 DIAGNOSIS — I4821 Permanent atrial fibrillation: Secondary | ICD-10-CM | POA: Diagnosis not present

## 2018-01-09 MED ORDER — OMEPRAZOLE 40 MG PO CPDR: 40.0000 mg | DELAYED_RELEASE_CAPSULE | Freq: Every day | ORAL | 3 refills | Status: DC

## 2018-01-09 NOTE — Patient Instructions (Signed)
Medication Instructions:  STOP AMIODARONE  If you need a refill on your cardiac medications before your next appointment, please call your pharmacy.   Lab work: NONE If you have labs (blood work) drawn today and your tests are completely normal, you will receive your results only by: Marland Kitchen. MyChart Message (if you have MyChart) OR . A paper copy in the mail If you have any lab test that is abnormal or we need to change your treatment, we will call you to review the results.  Testing/Procedures: NONE  Follow-Up: At [facility], you and your health needs are our priority.  As part of our continuing mission to provide you with exceptional heart care, we have created designated Provider Care Teams.  These Care Teams include your primary Cardiologist (physician) and Advanced Practice Providers (APPs -  Physician Assistants and Nurse Practitioners) who all work together to provide you with the care you need, when you need it. You will need a follow up appointment in 3 months.  Please call our office 2 months in advance to schedule this appointment.  You may see Donato SchultzMark Skains, MD or one of the following Advanced Practice Providers on your designated Care Team:   Norma FredricksonLori Gerhardt, NP Nada BoozerLaura Ingold, NP . Georgie ChardJill McDaniel, NP  Any Other Special Instructions Will Be Listed Below (If Applicable).

## 2018-01-09 NOTE — Progress Notes (Signed)
01/09/2018 Danny Nelson   [birth year]  [id number]  Primary Physician Marden Noble, MD Primary Cardiologist: Dr. Anne Fu   Reason for Visit/CC: f/u for atrial fibrillation and CAD   HPI:  Danny Nelson is a 84 y.o. male who is being seen today for f/u for atrial fibrillation and CAD. He just established care with our practice in September of this year. He was referred to [place] for preoperative assessment in light of significant past cardiac history (summarized below).   [name] has established cardiac history and has been treated at outside hospitals. Pre records faxed from PCP office [facility]), pt has h/o CAD and atrial fibrillation.  Apparently he has a history of coronary stent implantation performed in [place] at [facility] in 2009.  He has also been treated for atrial fibrillation at [facility] in [place] in 2011.  He had been on chronic anticoagulation therapy with Coumadin and his INRs were being followed by his PCP Dr. Kevan Ny, since moving to [facility] (now on Eliquis). He is also on low dose amiodarone.  Additional history is notable for family history of congenital heart disease and valvular heart disease in brother and sister.  Unfortunately the exact details are unknown.  Additional past medical history includes osteoarthritis, GERD, BPH, hypothyroidism and hypertension.  When he first presented in September, he denied any cardiac symptoms. His EKG showed atrial fibrillation w/ a CVR (on chronic amiodarone). Case was discussed with Dr. Anne Fu, who was DOD. Given his history but limited records, we elected to have him undergo a nuclear stress test prior to clearing. Stress test was low risk. An echo was also done given detection of a murmur. This showed normal LVEF and only mild AI. There was aortic sclerosis w/o stenosis.  He was seen after studies by Dr. Anne Fu, who cleared him to undergo TKR. Pt has not yet had surgery done. He is  waiting until January.   He continues to do well. He denies any changes since his last OV. No CP or dyspnea. He remains in atrial fibrillation w/ CVR and asymptomatic. Appears to be chronic afib. He has been on low dose amiodarone for many years and fearful of potential side effects. Would like to discontinue. He has made the transition from Coumadin to Eliquis and tolerating well. No side effects. No abnormal bleeding or falls. He had recent labs. SCr and H/H ok. Copay is reasonable. Able to afford. He will let us know if this becomes an issue in the future.   Current Meds  Medication Sig  . apixaban (ELIQUIS) 5 MG TABS tablet Take 1 tablet (5 mg total) by mouth 2 (two) times daily.  Marland Kitchen atorvastatin (LIPITOR) 20 MG tablet Take 20 mg by mouth daily.   . finasteride (PROSCAR) 5 MG tablet Take 5 mg by mouth daily.   . furosemide (LASIX) 40 MG tablet Take 1 tablet (40 mg total) by mouth daily.  Marland Kitchen levothyroxine (SYNTHROID, LEVOTHROID) 75 MCG tablet Take 75 mcg by mouth daily before breakfast.   . losartan (COZAAR) 50 MG tablet Take 1 tablet (50 mg total) by mouth daily.  . metoprolol succinate (TOPROL-XL) 25 MG 24 hr tablet Take 1 tablet by mouth daily.  Marland Kitchen omeprazole (PRILOSEC) 40 MG capsule Take 1 capsule (40 mg total) by mouth daily before breakfast.  . potassium chloride SA (K-DUR,KLOR-CON) 20 MEQ tablet Take 1 tablet (20 mEq total) by mouth daily.  . traMADol (ULTRAM) 50 MG tablet Take 50 mg by  mouth every 6 (six) hours as needed for severe pain.  . traMADol-acetaminophen (ULTRACET) 37.5-325 MG tablet Take 1 tablet by mouth as needed for severe pain.   . [DISCONTINUED] amiodarone (PACERONE) 100 MG tablet Take 100 mg by mouth daily.  . [DISCONTINUED] omeprazole (PRILOSEC) 40 MG capsule Take 40 mg by mouth daily before breakfast.   . [DISCONTINUED] traMADol (ULTRAM) 50 MG tablet Take 1-2 tablets (50-100 mg total) by mouth every 6 (six) hours as needed. (Patient taking differently: Take 50-100 mg by  mouth every 6 (six) hours as needed for severe pain. )   Allergies  Allergen Reactions  . Cocaine Hcl Other (See Comments)    VOMITING AND TACHYCARDIA  . Sudafed [Pseudoephedrine Hcl] Other (See Comments)    TACHYCARDIA   Past Medical History:  Diagnosis Date  . Anticoagulant long-term use   . Anticoagulation goal of INR 2 to 3   . Atrial fibrillation (HCC)   . BPH (benign prostatic hyperplasia)   . CAD (coronary artery disease)   . Dyspnea on exertion   . Hypertension   . Hypothyroidism   . Hypovitaminosis D   . Leg edema   . Osteoarthritis    No family history on file. No past surgical history on file. Social History   Socioeconomic History  . Marital status: Married    Spouse name: Not on file  . Number of children: 5  . Years of education: Not on file  . Highest education level: Not on file  Occupational History  . Not on file  Social Needs  . Financial resource strain: Not on file  . Food insecurity:    Worry: Not on file    Inability: Not on file  . Transportation needs:    Medical: Not on file    Non-medical: Not on file  Tobacco Use  . Smoking status: Never Smoker  . Smokeless tobacco: Never Used  Substance and Sexual Activity  . Alcohol use: Never    Frequency: Never  . Drug use: Never  . Sexual activity: Not on file  Lifestyle  . Physical activity:    Days per week: Not on file    Minutes per session: Not on file  . Stress: Not on file  Relationships  . Social connections:    Talks on phone: Not on file    Gets together: Not on file    Attends religious service: Not on file    Active member of club or organization: Not on file    Attends meetings of clubs or organizations: Not on file    Relationship status: Not on file  . Intimate partner violence:    Fear of current or ex partner: Not on file    Emotionally abused: Not on file    Physically abused: Not on file    Forced sexual activity: Not on file  Other Topics Concern  . Not on file    Social History Narrative  . Not on file     Review of Systems: General: negative for chills, fever, night sweats or weight changes.  Cardiovascular: negative for chest pain, dyspnea on exertion, edema, orthopnea, palpitations, paroxysmal nocturnal dyspnea or shortness of breath Dermatological: negative for rash Respiratory: negative for cough or wheezing Urologic: negative for hematuria Abdominal: negative for nausea, vomiting, diarrhea, bright red blood per rectum, melena, or hematemesis Neurologic: negative for visual changes, syncope, or dizziness All other systems reviewed and are otherwise negative except as noted above.   Physical Exam:  Blood  pressure 118/60, pulse 72, height 5\' 9"  (1.753 m), weight 199 lb 1.9 oz (90.3 kg), SpO2 97 %.  General appearance: alert, cooperative and no distress Neck: no carotid bruit and no JVD Lungs: clear to auscultation bilaterally Heart: irregularly irregular rhythm, regular rate Extremities: extremities normal, atraumatic, no cyanosis or edema Pulses: 2+ and symmetric Skin: Skin color, texture, turgor normal. No rashes or lesions Neurologic: Grossly normal  EKG atrial fibrillation w/ CVR, chronic  -- personally reviewed   ASSESSMENT AND PLAN:   1. CAD: Patient reports he had a myocardial infarction while living in [place] in 2009 and received 2 coronary stents. We do not have cath report, however recent NST done in our office was negative for ischemia. He remains stable and denies any anginal symptoms. Continue medical therapy.   2. Atrial Fibrillation: Pt reports first diagnosis was made in 2011.  It appears that he has undergone cardioversion as well as an attempted A. fib ablation while living in [place] several years ago.  Unfortunately these measures failed.  He remains in atrial fibrillation w/ CVR and asymptomatic. Appears to be chronic afib. He has been on low dose amiodarone for many years and fearful of potential side  effects. Would like to discontinue. I agree with this. We will d/c. He has made the transition from Coumadin to Eliquis and tolerating well. No side effects. No abnormal bleeding or falls. He had recent labs. SCr and H/H ok. Copay is reasonable. Able to afford. He will let us know if this becomes an issue in the future.   3. Chronic Anticoagulation:  Tolerating Eliquis. Reports full compliance. Denies falls and abnormal bleeding.   4. HTN: Controlled. current regimen.    5. Aortic Sclerosis/ Mild AI: noted on recent echo. Slight murmur on exam.  6. HLD: followed by PCP.  On statin therapy.  Follow-Up w/ Dr. Anne FuSkains in 6 months.    Delmer IslamSimmons PA-C, [facility] 01/09/2018 12:28 PM

## 2018-01-28 ENCOUNTER — Ambulatory Visit
Admission: RE | Admit: 2018-01-28 | Discharge: 2018-01-28 | Disposition: A | Discharge status: Home / Self Care | Payer: Medicare HMO | Source: Ambulatory Visit | Attending: Internal Medicine | Admitting: Internal Medicine

## 2018-01-28 ENCOUNTER — Other Ambulatory Visit: Payer: Self-pay | Admitting: Internal Medicine

## 2018-01-28 DIAGNOSIS — R109 Unspecified abdominal pain: Secondary | ICD-10-CM | POA: Diagnosis not present

## 2018-01-28 DIAGNOSIS — R1031 Right lower quadrant pain: Secondary | ICD-10-CM

## 2018-01-31 ENCOUNTER — Other Ambulatory Visit: Payer: Self-pay | Admitting: Internal Medicine

## 2018-01-31 ENCOUNTER — Ambulatory Visit
Admission: RE | Admit: 2018-01-31 | Discharge: 2018-01-31 | Disposition: A | Discharge status: Home / Self Care | Payer: Medicare HMO | Source: Ambulatory Visit | Attending: Internal Medicine | Admitting: Internal Medicine

## 2018-01-31 DIAGNOSIS — R1084 Generalized abdominal pain: Secondary | ICD-10-CM

## 2018-01-31 DIAGNOSIS — R109 Unspecified abdominal pain: Secondary | ICD-10-CM | POA: Diagnosis not present

## 2018-01-31 DIAGNOSIS — K4091 Unilateral inguinal hernia, without obstruction or gangrene, recurrent: Secondary | ICD-10-CM | POA: Diagnosis not present

## 2018-01-31 DIAGNOSIS — N200 Calculus of kidney: Secondary | ICD-10-CM | POA: Diagnosis not present

## 2018-01-31 MED ORDER — IOPAMIDOL (ISOVUE-300) INJECTION 61%
100.0000 mL | Freq: Once | INTRAVENOUS | Status: AC | PRN
  Administered 2018-01-31: 100 mL via INTRAVENOUS

## 2018-02-04 ENCOUNTER — Telehealth (INDEPENDENT_AMBULATORY_CARE_PROVIDER_SITE_OTHER): Payer: Self-pay | Admitting: Orthopaedic Surgery

## 2018-02-04 NOTE — Telephone Encounter (Signed)
Patient called wanting to get a return call from you. Staed his surgery is on 02/28/17 and he wants to go ahead and get his pre op done.  Please call and discuss with [phone].

## 2018-02-05 ENCOUNTER — Other Ambulatory Visit: Payer: Medicare HMO

## 2018-02-06 NOTE — Telephone Encounter (Signed)
I called and advised patient that surgery is in schedule so he can schedule pre-op.

## 2018-02-06 NOTE — Progress Notes (Signed)
Please place orders in Epic as patient is being scheduled for a pre-op appointment! Thank you! 

## 2018-02-07 DIAGNOSIS — R600 Localized edema: Secondary | ICD-10-CM | POA: Diagnosis not present

## 2018-02-07 DIAGNOSIS — M170 Bilateral primary osteoarthritis of knee: Secondary | ICD-10-CM | POA: Diagnosis not present

## 2018-02-07 DIAGNOSIS — Z1389 Encounter for screening for other disorder: Secondary | ICD-10-CM | POA: Diagnosis not present

## 2018-02-07 DIAGNOSIS — K219 Gastro-esophageal reflux disease without esophagitis: Secondary | ICD-10-CM | POA: Diagnosis not present

## 2018-02-07 DIAGNOSIS — I2510 Atherosclerotic heart disease of native coronary artery without angina pectoris: Secondary | ICD-10-CM | POA: Diagnosis not present

## 2018-02-07 DIAGNOSIS — I4891 Unspecified atrial fibrillation: Secondary | ICD-10-CM | POA: Diagnosis not present

## 2018-02-07 DIAGNOSIS — Z7901 Long term (current) use of anticoagulants: Secondary | ICD-10-CM | POA: Diagnosis not present

## 2018-02-07 DIAGNOSIS — I10 Essential (primary) hypertension: Secondary | ICD-10-CM | POA: Diagnosis not present

## 2018-02-07 DIAGNOSIS — E039 Hypothyroidism, unspecified: Secondary | ICD-10-CM | POA: Diagnosis not present

## 2018-02-07 DIAGNOSIS — Z0000 Encounter for general adult medical examination without abnormal findings: Secondary | ICD-10-CM | POA: Diagnosis not present

## 2018-02-07 DIAGNOSIS — N400 Enlarged prostate without lower urinary tract symptoms: Secondary | ICD-10-CM | POA: Diagnosis not present

## 2018-02-07 DIAGNOSIS — R109 Unspecified abdominal pain: Secondary | ICD-10-CM | POA: Diagnosis not present

## 2018-02-17 NOTE — Progress Notes (Signed)
01/31/2018- noted in Epic-CT abd.pelvis w/contrast.  01/09/2018- noted in Epic-office visit with Dr. Anne FuSkains and EKG  10/28/2017- noted in Epic-Stress test and ECHO

## 2018-02-17 NOTE — Patient Instructions (Addendum)
Danny Nelson  02/17/2018   Your procedure is scheduled on: Friday 02/28/2018  [facility] Main  Entr[facility]              Report to admitting at  0715 AM    Call this number if you have problems the morning of surgery [phone]    Remember: Do not eat food or drink liquids :After Midnight.             BRUSH YOUR TEETH MORNING OF SURGERY AND RINSE YOUR MOUTH OUT, NO CHEWING GUM CANDY OR MINTS.     Take these medicines the morning of surgery with A SIP OF WATER: Certirizine (Zyrtec), Gabapentin (Neurontin), Levothyroxine (Synthroid),  Metoprolol (Toprol-XL), Omeprazole (Prilosec)                                You may not have any metal on your body including hair pins and              piercings  Do not wear jewelry, make-up, lotions, powders or perfumes, deodorant             Men may shave face and neck.   Do not bring valuables to the hospital. [facility] IS NOT             RESPONSIBLE   FOR VALUABLES.  Contacts, dentures or bridgework may not be worn into surgery.  Leave suitcase in the car. After surgery it may be brought to your room.                  Please read over the following fact sheets you were given: _____________________________________________________________________             [facility] - Preparing for Surgery Before surgery, you can play an important role.  Because skin is not sterile, your skin needs to be as free of germs as possible.  You can reduce the number of germs on your skin by washing with CHG (chlorahexidine gluconate) soap before surgery.  CHG is an antiseptic cleaner which kills germs and bonds with the skin to continue killing germs even after washing. Please DO NOT use if you have an allergy to CHG or antibacterial soaps.  If your skin becomes reddened/irritated stop using the CHG and inform your nurse when you arrive at Short Stay. Do not shave (including legs and underarms) for at least 48 hours prior to the  first CHG shower.  You may shave your face/neck. Please follow these instructions carefully:  1.  Shower with CHG Soap the night before surgery and the  morning of Surgery.  2.  If you choose to wash your hair, wash your hair first as usual with your  normal  shampoo.  3.  After you shampoo, rinse your hair and body thoroughly to remove the  shampoo.                           4.  Use CHG as you would any other liquid soap.  You can apply chg directly  to the skin and wash                       Gently with a scrungie or clean washcloth.  5.  Apply the CHG Soap  to your body ONLY FROM THE NECK DOWN.   Do not use on face/ open                           Wound or open sores. Avoid contact with eyes, ears mouth and genitals (private parts).                       Wash face,  Genitals (private parts) with your normal soap.             6.  Wash thoroughly, paying special attention to the area where your surgery  will be performed.  7.  Thoroughly rinse your body with warm water from the neck down.  8.  DO NOT shower/wash with your normal soap after using and rinsing off  the CHG Soap.                9.  Pat yourself dry with a clean towel.            10.  Wear clean pajamas.            11.  Place clean sheets on your bed the night of your first shower and do not  sleep with pets. Day of Surgery : Do not apply any lotions/deodorants the morning of surgery.  Please wear clean clothes to the hospital/surgery center.  FAILURE TO FOLLOW THESE INSTRUCTIONS MAY RESULT IN THE CANCELLATION OF YOUR SURGERY PATIENT SIGNATURE_________________________________  NURSE SIGNATURE__________________________________

## 2018-02-18 ENCOUNTER — Other Ambulatory Visit: Payer: Self-pay

## 2018-02-18 ENCOUNTER — Other Ambulatory Visit (INDEPENDENT_AMBULATORY_CARE_PROVIDER_SITE_OTHER): Payer: Self-pay | Admitting: Physician Assistant

## 2018-02-18 ENCOUNTER — Encounter (HOSPITAL_COMMUNITY)
Admission: RE | Admit: 2018-02-18 | Discharge: 2018-02-18 | Disposition: A | Discharge status: Home / Self Care | Payer: Medicare HMO | Source: Ambulatory Visit | Attending: Orthopaedic Surgery | Admitting: Orthopaedic Surgery

## 2018-02-18 ENCOUNTER — Encounter (HOSPITAL_COMMUNITY): Payer: Self-pay

## 2018-02-18 DIAGNOSIS — Z01818 Encounter for other preprocedural examination: Secondary | ICD-10-CM | POA: Insufficient documentation

## 2018-02-18 DIAGNOSIS — M1711 Unilateral primary osteoarthritis, right knee: Secondary | ICD-10-CM | POA: Insufficient documentation

## 2018-02-18 HISTORY — DX: Cardiac arrhythmia, unspecified: I49.9

## 2018-02-18 HISTORY — DX: Other complications of anesthesia, initial encounter: T88.59XA

## 2018-02-18 HISTORY — DX: Adverse effect of unspecified anesthetic, initial encounter: T41.45XA

## 2018-02-18 LAB — BASIC METABOLIC PANEL
Anion gap: 7 (ref 5–15)
BUN: 18 mg/dL (ref 8–23)
CO2: 25 mmol/L (ref 22–32)
Calcium: 8.7 mg/dL — ABNORMAL LOW (ref 8.9–10.3)
Chloride: 108 mmol/L (ref 98–111)
Creatinine, Ser: 1.18 mg/dL (ref 0.61–1.24)
GFR calc Af Amer: >60 mL/min (ref >60)
GFR calc non Af Amer: 56 mL/min — ABNORMAL LOW (ref >60)
Glucose, Bld: 105 mg/dL — ABNORMAL HIGH (ref 70–99)
Potassium: 3.8 mmol/L (ref 3.5–5.1)
Sodium: 140 mmol/L (ref 135–145)

## 2018-02-18 LAB — CBC
HEMATOCRIT: 34.6 % — AB (ref 39.0–52.0)
Hemoglobin: 10.7 g/dL — ABNORMAL LOW (ref 13.0–17.0)
MCH: 29.2 pg (ref 26.0–34.0)
MCHC: 30.9 g/dL (ref 30.0–36.0)
MCV: 94.3 fL (ref 80.0–100.0)
Platelets: 251 10*3/uL (ref 150–400)
RBC: 3.67 MIL/uL — ABNORMAL LOW (ref 4.22–5.81)
RDW: 14.6 % (ref 11.5–15.5)
WBC: 9 10*3/uL (ref 4.0–10.5)
nRBC: 0 % (ref 0.0–0.2)

## 2018-02-18 LAB — SURGICAL PCR SCREEN
MRSA, PCR: NEGATIVE
Staphylococcus aureus: NEGATIVE

## 2018-02-28 ENCOUNTER — Inpatient Hospital Stay (HOSPITAL_COMMUNITY)
Admission: AD | Admit: 2018-02-28 | Discharge: 2018-03-04 | DRG: 470 | Disposition: A | Discharge status: Skilled Nursing Facility | Payer: Medicare HMO | Attending: Orthopaedic Surgery | Admitting: Orthopaedic Surgery

## 2018-02-28 ENCOUNTER — Ambulatory Visit (HOSPITAL_COMMUNITY): Payer: Medicare HMO | Admitting: Physician Assistant

## 2018-02-28 ENCOUNTER — Encounter (HOSPITAL_COMMUNITY)
Admission: AD | Disposition: A | Discharge status: Skilled Nursing Facility | Payer: Self-pay | Source: Home / Self Care | Attending: Orthopaedic Surgery

## 2018-02-28 ENCOUNTER — Ambulatory Visit (HOSPITAL_COMMUNITY): Payer: Medicare HMO | Admitting: Anesthesiology

## 2018-02-28 ENCOUNTER — Inpatient Hospital Stay (HOSPITAL_COMMUNITY): Payer: Medicare HMO

## 2018-02-28 ENCOUNTER — Encounter (HOSPITAL_COMMUNITY): Payer: Self-pay | Admitting: Anesthesiology

## 2018-02-28 ENCOUNTER — Other Ambulatory Visit: Payer: Self-pay

## 2018-02-28 DIAGNOSIS — G8918 Other acute postprocedural pain: Secondary | ICD-10-CM | POA: Diagnosis not present

## 2018-02-28 DIAGNOSIS — Z7401 Bed confinement status: Secondary | ICD-10-CM | POA: Diagnosis not present

## 2018-02-28 DIAGNOSIS — Z955 Presence of coronary angioplasty implant and graft: Secondary | ICD-10-CM

## 2018-02-28 DIAGNOSIS — K08109 Complete loss of teeth, unspecified cause, unspecified class: Secondary | ICD-10-CM | POA: Diagnosis present

## 2018-02-28 DIAGNOSIS — R2681 Unsteadiness on feet: Secondary | ICD-10-CM | POA: Diagnosis not present

## 2018-02-28 DIAGNOSIS — M1711 Unilateral primary osteoarthritis, right knee: Secondary | ICD-10-CM

## 2018-02-28 DIAGNOSIS — D62 Acute posthemorrhagic anemia: Secondary | ICD-10-CM | POA: Diagnosis not present

## 2018-02-28 DIAGNOSIS — R41841 Cognitive communication deficit: Secondary | ICD-10-CM | POA: Diagnosis not present

## 2018-02-28 DIAGNOSIS — Z884 Allergy status to anesthetic agent status: Secondary | ICD-10-CM | POA: Diagnosis not present

## 2018-02-28 DIAGNOSIS — K219 Gastro-esophageal reflux disease without esophagitis: Secondary | ICD-10-CM | POA: Diagnosis present

## 2018-02-28 DIAGNOSIS — Z888 Allergy status to other drugs, medicaments and biological substances status: Secondary | ICD-10-CM | POA: Diagnosis not present

## 2018-02-28 DIAGNOSIS — Z96651 Presence of right artificial knee joint: Secondary | ICD-10-CM | POA: Diagnosis not present

## 2018-02-28 DIAGNOSIS — M6281 Muscle weakness (generalized): Secondary | ICD-10-CM | POA: Diagnosis not present

## 2018-02-28 DIAGNOSIS — Z9049 Acquired absence of other specified parts of digestive tract: Secondary | ICD-10-CM

## 2018-02-28 DIAGNOSIS — I2510 Atherosclerotic heart disease of native coronary artery without angina pectoris: Secondary | ICD-10-CM | POA: Diagnosis present

## 2018-02-28 DIAGNOSIS — E039 Hypothyroidism, unspecified: Secondary | ICD-10-CM | POA: Diagnosis present

## 2018-02-28 DIAGNOSIS — Z79890 Hormone replacement therapy (postmenopausal): Secondary | ICD-10-CM | POA: Diagnosis not present

## 2018-02-28 DIAGNOSIS — R2689 Other abnormalities of gait and mobility: Secondary | ICD-10-CM | POA: Diagnosis not present

## 2018-02-28 DIAGNOSIS — R5381 Other malaise: Secondary | ICD-10-CM | POA: Diagnosis not present

## 2018-02-28 DIAGNOSIS — Z79899 Other long term (current) drug therapy: Secondary | ICD-10-CM | POA: Diagnosis not present

## 2018-02-28 DIAGNOSIS — Z7901 Long term (current) use of anticoagulants: Secondary | ICD-10-CM

## 2018-02-28 DIAGNOSIS — R278 Other lack of coordination: Secondary | ICD-10-CM | POA: Diagnosis not present

## 2018-02-28 DIAGNOSIS — N400 Enlarged prostate without lower urinary tract symptoms: Secondary | ICD-10-CM | POA: Diagnosis not present

## 2018-02-28 DIAGNOSIS — M21161 Varus deformity, not elsewhere classified, right knee: Secondary | ICD-10-CM | POA: Diagnosis present

## 2018-02-28 DIAGNOSIS — I10 Essential (primary) hypertension: Secondary | ICD-10-CM | POA: Diagnosis present

## 2018-02-28 DIAGNOSIS — Z471 Aftercare following joint replacement surgery: Secondary | ICD-10-CM | POA: Diagnosis not present

## 2018-02-28 DIAGNOSIS — M2550 Pain in unspecified joint: Secondary | ICD-10-CM | POA: Diagnosis not present

## 2018-02-28 HISTORY — PX: TOTAL KNEE ARTHROPLASTY: SHX125

## 2018-02-28 SURGERY — ARTHROPLASTY, KNEE, TOTAL
Anesthesia: Spinal | Site: Knee | Laterality: Right

## 2018-02-28 MED ORDER — SODIUM CHLORIDE 0.9 % IV SOLN
INTRAVENOUS | Status: DC
  Administered 2018-02-28 – 2018-03-01 (×2): via INTRAVENOUS

## 2018-02-28 MED ORDER — TAMSULOSIN HCL 0.4 MG PO CAPS
0.4000 mg | ORAL_CAPSULE | Freq: Every day | ORAL | Status: DC
  Administered 2018-02-28 – 2018-03-03 (×4): 0.4 mg via ORAL
  Filled 2018-02-28 (×5): qty 1

## 2018-02-28 MED ORDER — GABAPENTIN 100 MG PO CAPS
100.0000 mg | ORAL_CAPSULE | Freq: Every day | ORAL | Status: DC
  Administered 2018-03-01 – 2018-03-04 (×4): 100 mg via ORAL
  Filled 2018-02-28 (×4): qty 1

## 2018-02-28 MED ORDER — MEPERIDINE HCL 50 MG/ML IJ SOLN: 6.2500 mg | INTRAMUSCULAR | Status: DC | PRN

## 2018-02-28 MED ORDER — FENTANYL CITRATE (PF) 100 MCG/2ML IJ SOLN
25.0000 ug | INTRAMUSCULAR | Status: DC | PRN
  Administered 2018-02-28 (×3): 50 ug via INTRAVENOUS

## 2018-02-28 MED ORDER — LOSARTAN POTASSIUM 50 MG PO TABS
50.0000 mg | ORAL_TABLET | Freq: Every day | ORAL | Status: DC
  Administered 2018-02-28 – 2018-03-04 (×5): 50 mg via ORAL
  Filled 2018-02-28 (×5): qty 1

## 2018-02-28 MED ORDER — DOCUSATE SODIUM 100 MG PO CAPS
100.0000 mg | ORAL_CAPSULE | Freq: Two times a day (BID) | ORAL | Status: DC
  Administered 2018-02-28 – 2018-03-04 (×8): 100 mg via ORAL
  Filled 2018-02-28 (×8): qty 1

## 2018-02-28 MED ORDER — METHOCARBAMOL 500 MG PO TABS
500.0000 mg | ORAL_TABLET | Freq: Four times a day (QID) | ORAL | Status: DC | PRN
  Administered 2018-02-28 – 2018-03-04 (×6): 500 mg via ORAL
  Filled 2018-02-28 (×7): qty 1

## 2018-02-28 MED ORDER — FENTANYL CITRATE (PF) 100 MCG/2ML IJ SOLN
INTRAMUSCULAR | Status: AC
  Filled 2018-02-28: qty 2

## 2018-02-28 MED ORDER — SODIUM CHLORIDE 0.9 % IV SOLN
INTRAVENOUS | Status: DC | PRN
  Administered 2018-02-28: 25 ug/min via INTRAVENOUS

## 2018-02-28 MED ORDER — ONDANSETRON HCL 4 MG/2ML IJ SOLN
INTRAMUSCULAR | Status: DC | PRN
  Administered 2018-02-28: 4 mg via INTRAVENOUS

## 2018-02-28 MED ORDER — APIXABAN 5 MG PO TABS
5.0000 mg | ORAL_TABLET | Freq: Two times a day (BID) | ORAL | Status: DC
  Administered 2018-03-01 – 2018-03-04 (×7): 5 mg via ORAL
  Filled 2018-02-28 (×7): qty 1

## 2018-02-28 MED ORDER — FENTANYL CITRATE (PF) 100 MCG/2ML IJ SOLN
50.0000 ug | Freq: Once | INTRAMUSCULAR | Status: AC
  Administered 2018-02-28: 50 ug via INTRAVENOUS
  Filled 2018-02-28: qty 2

## 2018-02-28 MED ORDER — VITAMIN C 500 MG PO TABS
500.0000 mg | ORAL_TABLET | Freq: Every day | ORAL | Status: DC
  Administered 2018-02-28 – 2018-03-04 (×4): 500 mg via ORAL
  Filled 2018-02-28 (×4): qty 1

## 2018-02-28 MED ORDER — VITAMIN D 25 MCG (1000 UNIT) PO TABS
2000.0000 [IU] | ORAL_TABLET | Freq: Every day | ORAL | Status: DC
  Administered 2018-03-01 – 2018-03-04 (×3): 2000 [IU] via ORAL
  Filled 2018-02-28 (×4): qty 2

## 2018-02-28 MED ORDER — SODIUM CHLORIDE 0.9 % IR SOLN
Status: DC | PRN
  Administered 2018-02-28: 3000 mL

## 2018-02-28 MED ORDER — PHENYLEPHRINE HCL 10 MG/ML IJ SOLN
INTRAMUSCULAR | Status: AC
  Filled 2018-02-28: qty 1

## 2018-02-28 MED ORDER — POTASSIUM CHLORIDE CRYS ER 20 MEQ PO TBCR
20.0000 meq | EXTENDED_RELEASE_TABLET | ORAL | Status: DC
  Administered 2018-03-04: 20 meq via ORAL
  Filled 2018-02-28 (×3): qty 1

## 2018-02-28 MED ORDER — POLYETHYLENE GLYCOL 3350 17 G PO PACK
17.0000 g | PACK | Freq: Every day | ORAL | Status: DC | PRN
  Administered 2018-03-04: 17 g via ORAL
  Filled 2018-02-28: qty 1

## 2018-02-28 MED ORDER — ONDANSETRON HCL 4 MG/2ML IJ SOLN
4.0000 mg | Freq: Four times a day (QID) | INTRAMUSCULAR | Status: DC | PRN
  Administered 2018-03-02: 4 mg via INTRAVENOUS
  Filled 2018-02-28: qty 2

## 2018-02-28 MED ORDER — MIDAZOLAM HCL 2 MG/2ML IJ SOLN
1.0000 mg | Freq: Once | INTRAMUSCULAR | Status: DC
  Filled 2018-02-28: qty 2

## 2018-02-28 MED ORDER — TRANEXAMIC ACID-NACL 1000-0.7 MG/100ML-% IV SOLN
1000.0000 mg | INTRAVENOUS | Status: AC
  Administered 2018-02-28: 1000 mg via INTRAVENOUS
  Filled 2018-02-28: qty 100

## 2018-02-28 MED ORDER — OXYCODONE HCL 5 MG PO TABS
10.0000 mg | ORAL_TABLET | ORAL | Status: DC | PRN
  Administered 2018-02-28 – 2018-03-01 (×3): 10 mg via ORAL

## 2018-02-28 MED ORDER — METOCLOPRAMIDE HCL 5 MG/ML IJ SOLN: 5.0000 mg | Freq: Three times a day (TID) | INTRAMUSCULAR | Status: DC | PRN

## 2018-02-28 MED ORDER — PROPOFOL 500 MG/50ML IV EMUL
INTRAVENOUS | Status: DC | PRN
  Administered 2018-02-28: 50 ug/kg/min via INTRAVENOUS

## 2018-02-28 MED ORDER — PROMETHAZINE HCL 25 MG/ML IJ SOLN: 6.2500 mg | INTRAMUSCULAR | Status: DC | PRN

## 2018-02-28 MED ORDER — HYDROMORPHONE HCL 1 MG/ML IJ SOLN: 0.5000 mg | INTRAMUSCULAR | Status: DC | PRN

## 2018-02-28 MED ORDER — ROPIVACAINE HCL 7.5 MG/ML IJ SOLN
INTRAMUSCULAR | Status: DC | PRN
  Administered 2018-02-28: 20 mL via PERINEURAL

## 2018-02-28 MED ORDER — METOCLOPRAMIDE HCL 5 MG PO TABS: 5.0000 mg | ORAL_TABLET | Freq: Three times a day (TID) | ORAL | Status: DC | PRN

## 2018-02-28 MED ORDER — CEFAZOLIN SODIUM-DEXTROSE 1-4 GM/50ML-% IV SOLN
1.0000 g | Freq: Four times a day (QID) | INTRAVENOUS | Status: AC
  Administered 2018-02-28 (×2): 1 g via INTRAVENOUS
  Filled 2018-02-28 (×2): qty 50

## 2018-02-28 MED ORDER — LORATADINE 10 MG PO TABS
10.0000 mg | ORAL_TABLET | Freq: Every day | ORAL | Status: DC
  Administered 2018-03-01 – 2018-03-04 (×3): 10 mg via ORAL
  Filled 2018-02-28 (×4): qty 1

## 2018-02-28 MED ORDER — STERILE WATER FOR IRRIGATION IR SOLN
Status: DC | PRN
  Administered 2018-02-28: 2000 mL

## 2018-02-28 MED ORDER — ALUM & MAG HYDROXIDE-SIMETH 200-200-20 MG/5ML PO SUSP: 30.0000 mL | ORAL | Status: DC | PRN

## 2018-02-28 MED ORDER — METHOCARBAMOL 500 MG IVPB - SIMPLE MED
500.0000 mg | Freq: Four times a day (QID) | INTRAVENOUS | Status: DC | PRN
  Administered 2018-02-28: 500 mg via INTRAVENOUS
  Filled 2018-02-28: qty 50

## 2018-02-28 MED ORDER — LACTATED RINGERS IV SOLN: INTRAVENOUS | Status: DC

## 2018-02-28 MED ORDER — METHOCARBAMOL 500 MG IVPB - SIMPLE MED
INTRAVENOUS | Status: AC
  Filled 2018-02-28: qty 50

## 2018-02-28 MED ORDER — MENTHOL 3 MG MT LOZG: 1.0000 | LOZENGE | OROMUCOSAL | Status: DC | PRN

## 2018-02-28 MED ORDER — OXYCODONE HCL 5 MG PO TABS
5.0000 mg | ORAL_TABLET | ORAL | Status: DC | PRN
  Administered 2018-02-28 – 2018-03-01 (×2): 10 mg via ORAL
  Administered 2018-03-01: 5 mg via ORAL
  Administered 2018-03-01: 10 mg via ORAL
  Administered 2018-03-02 (×2): 5 mg via ORAL
  Filled 2018-02-28: qty 2
  Filled 2018-02-28: qty 1
  Filled 2018-02-28 (×4): qty 2
  Filled 2018-02-28 (×4): qty 1
  Filled 2018-02-28 (×2): qty 2

## 2018-02-28 MED ORDER — ACETAMINOPHEN 10 MG/ML IV SOLN
1000.0000 mg | Freq: Once | INTRAVENOUS | Status: AC
  Administered 2018-02-28: 1000 mg via INTRAVENOUS

## 2018-02-28 MED ORDER — DEXMEDETOMIDINE HCL IN NACL 200 MCG/50ML IV SOLN
INTRAVENOUS | Status: AC
  Filled 2018-02-28: qty 50

## 2018-02-28 MED ORDER — PANTOPRAZOLE SODIUM 40 MG PO TBEC
40.0000 mg | DELAYED_RELEASE_TABLET | Freq: Every day | ORAL | Status: DC
  Administered 2018-03-01 – 2018-03-04 (×4): 40 mg via ORAL
  Filled 2018-02-28 (×5): qty 1

## 2018-02-28 MED ORDER — PROPOFOL 10 MG/ML IV BOLUS
INTRAVENOUS | Status: AC
  Filled 2018-02-28: qty 40

## 2018-02-28 MED ORDER — DIPHENHYDRAMINE HCL 12.5 MG/5ML PO ELIX
12.5000 mg | ORAL_SOLUTION | ORAL | Status: DC | PRN
  Administered 2018-03-01 – 2018-03-04 (×4): 12.5 mg via ORAL
  Filled 2018-02-28 (×4): qty 5

## 2018-02-28 MED ORDER — BUPIVACAINE IN DEXTROSE 0.75-8.25 % IT SOLN
INTRATHECAL | Status: DC | PRN
  Administered 2018-02-28: 1.6 mL via INTRATHECAL

## 2018-02-28 MED ORDER — ACETAMINOPHEN 325 MG PO TABS
325.0000 mg | ORAL_TABLET | Freq: Four times a day (QID) | ORAL | Status: DC | PRN
  Administered 2018-03-01 – 2018-03-04 (×5): 650 mg via ORAL
  Filled 2018-02-28 (×5): qty 2

## 2018-02-28 MED ORDER — 0.9 % SODIUM CHLORIDE (POUR BTL) OPTIME
TOPICAL | Status: DC | PRN
  Administered 2018-02-28: 1000 mL

## 2018-02-28 MED ORDER — NITROGLYCERIN 0.4 MG SL SUBL: 0.4000 mg | SUBLINGUAL_TABLET | SUBLINGUAL | Status: DC | PRN

## 2018-02-28 MED ORDER — ACETAMINOPHEN 10 MG/ML IV SOLN
INTRAVENOUS | Status: AC
  Filled 2018-02-28: qty 100

## 2018-02-28 MED ORDER — ADULT MULTIVITAMIN W/MINERALS CH
1.0000 | ORAL_TABLET | Freq: Every day | ORAL | Status: DC
  Administered 2018-03-01 – 2018-03-04 (×3): 1 via ORAL
  Filled 2018-02-28 (×4): qty 1

## 2018-02-28 MED ORDER — GABAPENTIN 100 MG PO CAPS
200.0000 mg | ORAL_CAPSULE | Freq: Every day | ORAL | Status: DC
  Administered 2018-02-28 – 2018-03-03 (×4): 200 mg via ORAL
  Filled 2018-02-28 (×4): qty 2

## 2018-02-28 MED ORDER — CHLORHEXIDINE GLUCONATE 4 % EX LIQD: 60.0000 mL | Freq: Once | CUTANEOUS | Status: DC

## 2018-02-28 MED ORDER — FUROSEMIDE 40 MG PO TABS
40.0000 mg | ORAL_TABLET | ORAL | Status: DC
  Administered 2018-03-02 – 2018-03-04 (×2): 40 mg via ORAL
  Filled 2018-02-28 (×3): qty 1

## 2018-02-28 MED ORDER — PHENOL 1.4 % MT LIQD
1.0000 | OROMUCOSAL | Status: DC | PRN
  Filled 2018-02-28: qty 177

## 2018-02-28 MED ORDER — DEXMEDETOMIDINE BOLUS VIA INFUSION: 0.2000 ug/kg | Freq: Once | INTRAVENOUS | Status: DC

## 2018-02-28 MED ORDER — LACTATED RINGERS IV SOLN
INTRAVENOUS | Status: DC
  Administered 2018-02-28: 08:00:00 via INTRAVENOUS

## 2018-02-28 MED ORDER — ONDANSETRON HCL 4 MG PO TABS: 4.0000 mg | ORAL_TABLET | Freq: Four times a day (QID) | ORAL | Status: DC | PRN

## 2018-02-28 MED ORDER — METOPROLOL SUCCINATE ER 25 MG PO TB24
25.0000 mg | ORAL_TABLET | Freq: Every day | ORAL | Status: DC
  Administered 2018-03-01 – 2018-03-04 (×4): 25 mg via ORAL
  Filled 2018-02-28 (×4): qty 1

## 2018-02-28 MED ORDER — FINASTERIDE 5 MG PO TABS
5.0000 mg | ORAL_TABLET | Freq: Every day | ORAL | Status: DC
  Administered 2018-02-28 – 2018-03-04 (×4): 5 mg via ORAL
  Filled 2018-02-28 (×5): qty 1

## 2018-02-28 MED ORDER — CEFAZOLIN SODIUM-DEXTROSE 2-4 GM/100ML-% IV SOLN
2.0000 g | INTRAVENOUS | Status: AC
  Administered 2018-02-28: 2 g via INTRAVENOUS
  Filled 2018-02-28: qty 100

## 2018-02-28 MED ORDER — ATORVASTATIN CALCIUM 20 MG PO TABS
20.0000 mg | ORAL_TABLET | Freq: Every day | ORAL | Status: DC
  Administered 2018-02-28 – 2018-03-03 (×4): 20 mg via ORAL
  Filled 2018-02-28 (×4): qty 1

## 2018-02-28 MED ORDER — LEVOTHYROXINE SODIUM 50 MCG PO TABS
50.0000 ug | ORAL_TABLET | Freq: Every day | ORAL | Status: DC
  Administered 2018-03-01 – 2018-03-04 (×4): 50 ug via ORAL
  Filled 2018-02-28 (×4): qty 1

## 2018-02-28 SURGICAL SUPPLY — 57 items
BAG ZIPLOCK 12X15 (MISCELLANEOUS) IMPLANT
BANDAGE ACE 6X5 VEL STRL LF (GAUZE/BANDAGES/DRESSINGS) ×2 IMPLANT
BANDAGE ELASTIC 6 VELCRO ST LF (GAUZE/BANDAGES/DRESSINGS) ×1 IMPLANT
BASEPLATE TIBIAL PRIMARY SZ5 (Plate) ×1 IMPLANT
BEARIN INSERT TIBIAL SZ5 9 (Orthopedic Implant) ×2 IMPLANT
BEARING INSERT TIBIAL SZ5 9 (Orthopedic Implant) IMPLANT
BENZOIN TINCTURE PRP APPL 2/3 (GAUZE/BANDAGES/DRESSINGS) ×1 IMPLANT
BLADE SAG 18X100X1.27 (BLADE) ×1 IMPLANT
BLADE SURG SZ10 CARB STEEL (BLADE) ×4 IMPLANT
BOWL SMART MIX CTS (DISPOSABLE) ×1 IMPLANT
CEMENT BONE SIMPLEX SPEEDSET (Cement) ×2 IMPLANT
CLSR STERI-STRIP ANTIMIC 1/2X4 (GAUZE/BANDAGES/DRESSINGS) ×1 IMPLANT
COVER SURGICAL LIGHT HANDLE (MISCELLANEOUS) ×2 IMPLANT
COVER WAND RF STERILE (DRAPES) IMPLANT
CUFF TOURN SGL QUICK 34 (TOURNIQUET CUFF) ×1
CUFF TRNQT CYL 34X4X40X1 (TOURNIQUET CUFF) ×1 IMPLANT
DECANTER SPIKE VIAL GLASS SM (MISCELLANEOUS) IMPLANT
DRAPE U-SHAPE 47X51 STRL (DRAPES) ×2 IMPLANT
DRSG PAD ABDOMINAL 8X10 ST (GAUZE/BANDAGES/DRESSINGS) ×2 IMPLANT
DURAPREP 26ML APPLICATOR (WOUND CARE) ×2 IMPLANT
ELECT REM PT RETURN 15FT ADLT (MISCELLANEOUS) ×2 IMPLANT
FEMORAL PEG DISTAL FIXATION (Orthopedic Implant) ×1 IMPLANT
FEMORAL POST STABILIZED NO 6 (Orthopedic Implant) ×1 IMPLANT
GAUZE SPONGE 4X4 12PLY STRL (GAUZE/BANDAGES/DRESSINGS) ×2 IMPLANT
GAUZE XEROFORM 1X8 LF (GAUZE/BANDAGES/DRESSINGS) ×1 IMPLANT
GLOVE BIO SURGEON STRL SZ7.5 (GLOVE) ×2 IMPLANT
GLOVE BIOGEL PI IND STRL 8 (GLOVE) ×2 IMPLANT
GLOVE BIOGEL PI INDICATOR 8 (GLOVE) ×2
GLOVE ECLIPSE 8.0 STRL XLNG CF (GLOVE) ×2 IMPLANT
GOWN STRL REUS W/TWL XL LVL3 (GOWN DISPOSABLE) ×4 IMPLANT
HANDPIECE INTERPULSE COAX TIP (DISPOSABLE) ×1
HOLDER FOLEY CATH W/STRAP (MISCELLANEOUS) ×1 IMPLANT
IMMOBILIZER KNEE 20 (SOFTGOODS) ×2
IMMOBILIZER KNEE 20 THIGH 36 (SOFTGOODS) ×1 IMPLANT
NDL SAFETY ECLIPSE 18X1.5 (NEEDLE) IMPLANT
NEEDLE HYPO 18GX1.5 SHARP (NEEDLE)
NS IRRIG 1000ML POUR BTL (IV SOLUTION) ×2 IMPLANT
PACK TOTAL KNEE CUSTOM (KITS) ×2 IMPLANT
PAD ABD 8X10 STRL (GAUZE/BANDAGES/DRESSINGS) ×1 IMPLANT
PADDING CAST COTTON 6X4 STRL (CAST SUPPLIES) ×3 IMPLANT
PATELLA A 35MMX10MM (Knees) ×1 IMPLANT
PROTECTOR NERVE ULNAR (MISCELLANEOUS) ×2 IMPLANT
SET HNDPC FAN SPRY TIP SCT (DISPOSABLE) ×1 IMPLANT
SET PAD KNEE POSITIONER (MISCELLANEOUS) ×2 IMPLANT
STAPLER VISISTAT 35W (STAPLE) ×1 IMPLANT
STRIP CLOSURE SKIN 1/2X4 (GAUZE/BANDAGES/DRESSINGS) ×1 IMPLANT
SUT MNCRL AB 4-0 PS2 18 (SUTURE) IMPLANT
SUT VIC AB 0 CT1 27 (SUTURE) ×1
SUT VIC AB 0 CT1 27XBRD ANTBC (SUTURE) ×1 IMPLANT
SUT VIC AB 1 CT1 36 (SUTURE) ×5 IMPLANT
SUT VIC AB 2-0 CT1 27 (SUTURE) ×2
SUT VIC AB 2-0 CT1 TAPERPNT 27 (SUTURE) ×2 IMPLANT
SYR 3ML LL SCALE MARK (SYRINGE) IMPLANT
TRAY FOLEY MTR SLVR 16FR STAT (SET/KITS/TRAYS/PACK) ×2 IMPLANT
WATER STERILE IRR 1000ML POUR (IV SOLUTION) ×2 IMPLANT
WRAP KNEE MAXI GEL POST OP (GAUZE/BANDAGES/DRESSINGS) ×2 IMPLANT
YANKAUER SUCT BULB TIP 10FT TU (MISCELLANEOUS) ×2 IMPLANT

## 2018-02-28 NOTE — Anesthesia Procedure Notes (Signed)
Date/Time: 02/28/2018 10:40 AM Performed by: Thornell Mule, CRNA Oxygen Delivery Method: Simple face mask

## 2018-02-28 NOTE — Transfer of Care (Signed)
Immediate Anesthesia Transfer of Care Note  Patient: Danny Nelson  Procedure(s) Performed: Procedure(s): RIGHT TOTAL KNEE ARTHROPLASTY (Right)  Patient Location: PACU  Anesthesia Type:Spinal  Level of Consciousness:  sedated, patient cooperative and responds to stimulation  Airway & Oxygen Therapy:Patient Spontanous Breathing and Patient connected to face mask oxgen  Post-op Assessment:  Report given to PACU RN and Post -op Vital signs reviewed and stable  Post vital signs:  Reviewed and stable  Last Vitals:  Vitals:   02/28/18 0950 02/28/18 0953  BP:  (!) 156/92  Pulse: 78 81  Resp: 16 16  Temp:    SpO2: 100% 100%    Complications: No apparent anesthesia complications

## 2018-02-28 NOTE — Anesthesia Preprocedure Evaluation (Addendum)
Anesthesia Evaluation  Patient identified by MRN, date of birth, ID band Patient awake    Reviewed: Allergy & Precautions, Patient's Chart, lab work & pertinent test results  Airway Mallampati: I  TM Distance: >3 FB     Dental  (+) Edentulous Upper, Edentulous Lower   Pulmonary neg pulmonary ROS,    breath sounds clear to auscultation       Cardiovascular hypertension, Pt. on home beta blockers and Pt. on medications + CAD  + dysrhythmias Atrial Fibrillation  Rhythm:Irregular Rate:Normal     Neuro/Psych negative neurological ROS     GI/Hepatic Neg liver ROS, GERD  Medicated,  Endo/Other  Hypothyroidism   Renal/GU negative Renal ROS     Musculoskeletal  (+) Arthritis ,   Abdominal Normal abdominal exam  (+)   Peds  Hematology negative hematology ROS (+)   Anesthesia Other Findings   Reproductive/Obstetrics                            Lab Results  Component Value Date   WBC 9.0 02/18/2018   HGB 10.7 (L) 02/18/2018   HCT 34.6 (L) 02/18/2018   MCV 94.3 02/18/2018   PLT 251 02/18/2018   Lab Results  Component Value Date   CREATININE 1.18 02/18/2018   BUN 18 02/18/2018   NA 140 02/18/2018   K 3.8 02/18/2018   CL 108 02/18/2018   CO2 25 02/18/2018   Echo: - Left ventricle: The cavity size was normal. There was mild focal   basal hypertrophy of the septum. The estimated ejection fraction   was 55%. Wall motion was normal; there were no regional wall   motion abnormalities. The study was not technically sufficient to   allow evaluation of LV diastolic dysfunction due to atrial   fibrillation. - Aortic valve: Trileaflet; moderately calcified leaflets.   Sclerosis without stenosis. There was mild regurgitation. - Aorta: Mildly dilated aortic root and ascending aorta. Aortic   root dimension: 38 mm (ED). Ascending aortic diameter: 41 mm (S). - Mitral valve: Moderately calcified  annulus. Moderately calcified   leaflets . There was no evidence for stenosis. There was trivial   regurgitation. Valve area by pressure half-time: 3.33 cm^2. - Left atrium: The atrium was severely dilated. - Right ventricle: The cavity size was normal. Systolic function   was normal. - Tricuspid valve: Peak RV-RA gradient (S): 41 mm Hg. - Pulmonary arteries: PA peak pressure: 44 mm Hg (S). - Inferior vena cava: The vessel was normal in size. The   respirophasic diameter changes were in the normal range (>= 50%),   consistent with normal central venous pressure.  EKG: atrial fibrillation.   Anesthesia Physical Anesthesia Plan  ASA: III  Anesthesia Plan: Spinal   Post-op Pain Management:    Induction: Intravenous  PONV Risk Score and Plan: 2 and Ondansetron and Propofol infusion  Airway Management Planned: Natural Airway and Simple Face Mask  Additional Equipment: None  Intra-op Plan:   Post-operative Plan:   Informed Consent: I have reviewed the patients History and Physical, chart, labs and discussed the procedure including the risks, benefits and alternatives for the proposed anesthesia with the patient or authorized representative who has indicated his/her understanding and acceptance.     Plan Discussed with: CRNA  Anesthesia Plan Comments:        Anesthesia Quick Evaluation

## 2018-02-28 NOTE — Evaluation (Signed)
Physical Therapy Evaluation Patient Details Name: Danny Nelson MRN: [id number] DOB: [birth year] Today's Date: 02/28/2018   History of Present Illness  Pt s/p R TKR and with hx of CAD, a-fib, dysrythmia   Clinical Impression  Pt s/p R TKR and presents with decreased R LE strength/ROM and post op pain limiting functional mobility.  Pt would benefit from follow up rehab at SNF level to maximize IND and safety prior to return home with ltd assist.    Follow Up Recommendations SNF    Equipment Recommendations  None recommended by PT    Recommendations for Other Services       Precautions / Restrictions Precautions Precautions: Knee;Fall Required Braces or Orthoses: Knee Immobilizer - Right Knee Immobilizer - Right: Discontinue once straight leg raise with < 10 degree lag Restrictions Weight Bearing Restrictions: No Other Position/Activity Restrictions: WBAT      Mobility  Bed Mobility Overal bed mobility: Needs Assistance Bed Mobility: Supine to Sit     Supine to sit: Min assist     General bed mobility comments: cues for sequence and use of L LE to self assist  Transfers Overall transfer level: Needs assistance Equipment used: Rolling walker (2 wheeled) Transfers: Sit to/from Stand Sit to Stand: Min assist         General transfer comment: cues for LE management and use of UEs to self assist  Ambulation/Gait Ambulation/Gait assistance: Min assist Gait Distance (Feet): 26 Feet Assistive device: Rolling walker (2 wheeled) Gait Pattern/deviations: Step-to pattern;Decreased step length - right;Decreased step length - left;Shuffle;Trunk flexed Gait velocity: decr   General Gait Details: cues for sequence, posture and position from [employer]            Wheelchair Mobility    Modified Rankin (Stroke Patients Only)       Balance Overall balance assessment: Mild deficits observed, not formally tested                                           Pertinent Vitals/Pain Pain Assessment: 0-10 Pain Score: 5  Pain Location: R knee Pain Descriptors / Indicators: Aching;Sore Pain Intervention(s): Limited activity within patient's tolerance;Monitored during session;Premedicated before session;Ice applied    Home Living Family/patient expects to be discharged to:: Skilled nursing facility Living Arrangements: Alone                    Prior Function Level of Independence: Independent               Hand Dominance        Extremity/Trunk Assessment   Upper Extremity Assessment Upper Extremity Assessment: Overall WFL for tasks assessed    Lower Extremity Assessment Lower Extremity Assessment: RLE deficits/detail       Communication   Communication: No difficulties  Cognition Arousal/Alertness: Awake/alert Behavior During Therapy: WFL for tasks assessed/performed Overall Cognitive Status: Within Functional Limits for tasks assessed                                        General Comments      Exercises Total Joint Exercises Ankle Circles/Pumps: AROM;Both;15 reps;Supine   Assessment/Plan    PT Assessment Patient needs continued PT services  PT Problem List Decreased strength;Decreased range of motion;Decreased activity tolerance;Decreased mobility;Pain;Decreased knowledge of use of  DME       PT Treatment Interventions DME instruction;Gait training;Stair training;Functional mobility training;Therapeutic activities;Therapeutic exercise;Patient/family education    PT Goals (Current goals can be found in the Care Plan section)  Acute Rehab PT Goals Patient Stated Goal: Regain IND PT Goal Formulation: With patient Time For Goal Achievement: 03/14/18 Potential to Achieve Goals: Good    Frequency 7X/week   Barriers to discharge        Co-evaluation               AM-PAC PT "6 Clicks" Mobility  Outcome Measure Help needed turning from your back to your side while in a  flat bed without using bedrails?: A Little Help needed moving from lying on your back to sitting on the side of a flat bed without using bedrails?: A Little Help needed moving to and from a bed to a chair (including a wheelchair)?: A Little Help needed standing up from a chair using your arms (e.g., wheelchair or bedside chair)?: A Little Help needed to walk in hospital room?: A Little Help needed climbing 3-5 steps with a railing? : A Lot 6 Click Score: 17    End of Session Equipment Utilized During Treatment: Gait belt;Right knee immobilizer Activity Tolerance: Patient tolerated treatment well Patient left: in chair;with call bell/phone within reach;with family/visitor present Nurse Communication: Mobility status PT Visit Diagnosis: Difficulty in walking, not elsewhere classified (R26.2)    Time: 1457-1530 PT Time Calculation (min) (ACUTE ONLY): 33 min   Charges:   PT Evaluation $PT Eval Low Complexity: 1 Low PT Treatments $Gait Training: 8-22 mins        Mauro KaufmannHunter Jolly Carlini PT Acute Rehabilitation Services Pager [phone] Office [phone]   Nerea Bordenave 02/28/2018, 3:48 PM

## 2018-02-28 NOTE — Brief Op Note (Signed)
02/28/2018  11:35 AM  PATIENT:  Demerick Zia  85 y.o. male  PRE-OPERATIVE DIAGNOSIS:  osteoarthritis right knee  POST-OPERATIVE DIAGNOSIS:  osteoarthritis right knee  PROCEDURE:  Procedure(s): RIGHT TOTAL KNEE ARTHROPLASTY (Right)  SURGEON:  Surgeon(s) and Role:    Kathryne Hitch, MD - Primary  PHYSICIAN ASSISTANT: Rexene Edison, PA-C  ANESTHESIA:   regional and spinal  EBL:  50 mL   COUNTS:  YES  TOURNIQUET:   Total Tourniquet Time Documented: Thigh (Right) - 48 minutes Total: Thigh (Right) - 48 minutes   DICTATION: .Other Dictation: Dictation Number [id number]  PLAN OF CARE: Admit to inpatient   PATIENT DISPOSITION:  PACU - hemodynamically stable.   Delay start of Pharmacological VTE agent (>24hrs) due to surgical blood loss or risk of bleeding: no

## 2018-02-28 NOTE — Op Note (Signed)
Danny Nelson, Danny Nelson MEDICAL RECORD KG:[id number] ACCOUNT [id number] DATE OF BIRTH:1929/12/23 FACILITY: [facility] LOCATION: [facility] PHYSICIAN:Antionio Negron Aretha Parrot, MD  OPERATIVE REPORT  DATE OF PROCEDURE:  02/28/2018  PREOPERATIVE DIAGNOSIS:  Severe osteoarthritis and degenerative joint disease, right knee with varus malalignment.  POSTOPERATIVE DIAGNOSIS:  Severe osteoarthritis and degenerative joint disease, right knee with varus malalignment.  PROCEDURE:  Right total knee arthroplasty.  IMPLANTS:  Stryker Triathlon cemented knee system with size 6 femur, size 5 tibial tray, 9 mm fixed bearing polyethylene insert, size 35 cemented patellar button.  SURGEON:  Vanita Panda. Magnus Ivan, MD  ASSISTANT:  Richardean Canal, PA-C  ANESTHESIA: 1.  Right lower extremity adductor canal block. 2.  Spinal.  ANTIBIOTICS:  Two grams IV Ancef.  TOURNIQUET TIME:  Less than 1 hour.  ESTIMATED BLOOD LOSS:  Less than 100 mL.  COMPLICATIONS:  None.  INDICATIONS:  The patient is an 85 year old active gentleman that I have seen for a long period of time.  He has debilitating arthritis involving his right knee.  At this point, he has tried and failed all forms of conservative treatment.  His pain is  daily and it has detrimentally affected his activities of daily living, his quality of life and his mobility.  We had scheduled him for total knee replacement before but we have really needed to work on his medical health status due to his risks.  We  have received clearance finally from the cardiologist and pulmonologist and his medical physicians.  He has lost weight as well.  He still wished to proceed with surgery in spite of the risks given the fact that his pain is just so severe.  At this  point, he understands fully the risk of acute blood loss anemia, nerve or vessel injury, fracture, infection, DVT, and implant failure.  He understands our goals are to decrease pain, improve mobility and overall  improve quality of life.  DESCRIPTION OF PROCEDURE:  After informed consent was obtained and appropriate right knee was marked.  An adductor canal block was obtained in the holding room of his right lower extremity.  He was then brought to the operating room and sat up on the  operating table where spinal anesthesia was obtained.  He was then laid in supine position on the operating table.  Foley catheter was placed and nonsterile tourniquet was placed around her upper right thigh.  His right thigh, knee, leg, ankle, foot were  prepped and draped with DuraPrep and sterile drapes.  A time-out was called to identify correct patient, correct right knee.  I then used an Esmarch to wrap that leg and tourniquet was inflated to 300 mm of pressure.  I then made a direct midline  incision over the patella and carried this proximally and distally.  We dissected down the knee joint and carried out a medial parapatellar arthrotomy.  We then found a large joint effusion.  With the knee in a flexed position, we removed some synovium  from the knee as well as remnants of ACL, PCL, medial and lateral meniscus.  We found significant cartilage wear in the knee.  We then set our proximal tibia cut with an extramedullary guide correction for varus and valgus and neutral slope taking 9 mm  off the high side.  We made this cut without difficulty.  We then used an intramedullary guide for the femur and the notch area setting our distal femoral cut for a right knee with 5 degrees externally rotated and a 10 mm  distal femoral cut.  We made  this cut without difficulty and brought the knee back down to full extension including more debris from the back of the knee.  We then placed a 9 mm extension block and we achieved full extension.  We then went back to the femur and our sizing guide  based off the epicondylar axis and 3 degrees externally rotated for right knee.  We chose a size 6 femur based off of that.  We put a 4-in-1  cutting block for a size 6 femur, made our anterior and posterior cuts followed by our chamfer cuts.  With the  knee in a flexed position, we placed a 9 mm flexion block and felt we had a balanced his flexion and extension gaps.  We then put our femoral box cut for a size 6 femur, made our box cut without difficulty.  We then went back to the tibia and chose a  size 5 tibial tray for coverage setting the rotation off the tibial tubercle and the femur.  We made the keel punch off of this without difficulty.  Then with a size 5 trial tibia followed by the size 6 right trial femur, we placed a 9 mm fixed bearing  polyethylene insert and we were pleased with range of motion and stability with that.  We then made our patellar cut and drilled 3 holes for a size 35 patellar button.  We then removed all instrumentation from the knee and irrigated the knee with normal  saline solution using pulsatile lavage.  We mixed our cement and cemented the real [employer] Triathlon tibial tray size 5 by the real size 6 right femur.  We removed cement debris from the knee and placed our 9 mm fixed bearing polyethylene insert.  We  also cemented our patellar button.  We removed cement debris from the knee and when the cement had hardened, we let the tourniquet down.  Hemostasis obtained with electrocautery.  We then closed the arthrotomy with interrupted #1 Vicryl suture followed  by 0 Vicryl in the deep tissue, 2-0 Vicryl subcutaneous tissue and interrupted staples on the skin.  Xeroform well-padded sterile dressing was applied.  He was taken to recovery room in stable condition.  All final counts were correct.  There were no  complications noted.  Of note, Rexene EdisonGil Clark, PA-C, assisted the entire case.  His assistance was crucial for facilitating all aspects of this case.  TN/NUANCE  D:02/28/2018 T:02/28/2018 JOB:[id number]

## 2018-02-28 NOTE — H&P (Signed)
TOTAL KNEE ADMISSION H&P  Patient is being admitted for right total knee arthroplasty.  Subjective:  Chief Complaint:right knee pain.  HPI: Danny Nelson, 85 y.o. male, has a history of pain and functional disability in the right knee due to arthritis and has failed non-surgical conservative treatments for greater than 12 weeks to includeNSAID's and/or analgesics, corticosteriod injections, viscosupplementation injections, flexibility and strengthening excercises, use of assistive devices, weight reduction as appropriate and activity modification.  Onset of symptoms was gradual, starting 5 years ago with gradually worsening course since that time. The patient noted no past surgery on the right knee(s).  Patient currently rates pain in the right knee(s) at 10 out of 10 with activity. Patient has night pain, worsening of pain with activity and weight bearing, pain that interferes with activities of daily living, pain with passive range of motion, crepitus and joint swelling.  Patient has evidence of subchondral sclerosis, periarticular osteophytes and joint space narrowing by imaging studies. There is no active infection.  Patient Active Problem List   Diagnosis Date Noted  . Unilateral primary osteoarthritis, right knee 02/28/2018  . Unilateral primary osteoarthritis, left knee 12/10/2017   Past Medical History:  Diagnosis Date  . Anticoagulant long-term use   . Anticoagulation goal of INR 2 to 3   . Atrial fibrillation (HCC)   . BPH (benign prostatic hyperplasia)   . CAD (coronary artery disease)   . Complication of anesthesia    for throat surgery, was nasallyngiven cocaine and had a reaction from this -tachycardia and vomiting , then for spinal for another surgery, had problems with voiding-nneded a catheter  . Dyspnea on exertion   . Dysrhythmia    A-Fib  . Hypertension   . Hypothyroidism   . Hypovitaminosis D   . Leg edema   . Osteoarthritis     Past Surgical History:  Procedure  Laterality Date  . CHOLECYSTECTOMY    . CORONARY ANGIOPLASTY     2 cardiac stents  . left foot surgery  1989   for spurs  . left shoulder surgery     torn left shoulder ligament  . right rotator cuff     right shoulder  . vocal cord surgery  2003    No current facility-administered medications for this encounter.    Current Outpatient Medications  Medication Sig Dispense Refill Last Dose  . acetaminophen (TYLENOL) 650 MG CR tablet Take 650 mg by mouth every 8 (eight) hours as needed for pain.     Marland Kitchen. apixaban (ELIQUIS) 5 MG TABS tablet Take 1 tablet (5 mg total) by mouth 2 (two) times daily. 180 tablet 3 Taking  . atorvastatin (LIPITOR) 20 MG tablet Take 20 mg by mouth at bedtime.    Taking  . cetirizine (ZYRTEC) 10 MG tablet Take 10 mg by mouth daily.     . Cholecalciferol (VITAMIN D) 50 MCG (2000 UT) CAPS Take 2,000 Units by mouth daily.     . diphenhydramine-acetaminophen (TYLENOL PM) 25-500 MG TABS tablet Take 2 tablets by mouth at bedtime.     . docusate sodium (COLACE) 100 MG capsule Take 100 mg by mouth at bedtime.     . finasteride (PROSCAR) 5 MG tablet Take 5 mg by mouth daily.    Taking  . furosemide (LASIX) 40 MG tablet Take 1 tablet (40 mg total) by mouth daily. (Patient taking differently: Take 40 mg by mouth every other day. ) 90 tablet 3 Taking  . gabapentin (NEURONTIN) 100 MG capsule Take 100-200  mg by mouth See admin instructions. Take 100 mg by mouth in the morning and 200 mg at night     . levothyroxine (SYNTHROID, LEVOTHROID) 50 MCG tablet Take 50 mcg by mouth daily before breakfast.    Taking  . losartan (COZAAR) 50 MG tablet Take 1 tablet (50 mg total) by mouth daily. 90 tablet 3 Taking  . Menthol, Topical Analgesic, (BIOFREEZE EX) Apply 1 application topically at bedtime as needed (pain).     . metoprolol succinate (TOPROL-XL) 25 MG 24 hr tablet Take 25 mg by mouth daily.    Taking  . Multiple Vitamin (MULTIVITAMIN WITH MINERALS) TABS tablet Take 1 tablet by mouth  daily.     . nitroGLYCERIN (NITROSTAT) 0.4 MG SL tablet Place 0.4 mg under the tongue every 5 (five) minutes as needed for chest pain.     . Omega-3 1000 MG CAPS Take 1,000 mg by mouth daily.     Marland Kitchen. omeprazole (PRILOSEC) 40 MG capsule Take 1 capsule (40 mg total) by mouth daily before breakfast. 90 capsule 3   . potassium chloride SA (K-DUR,KLOR-CON) 20 MEQ tablet Take 1 tablet (20 mEq total) by mouth daily. (Patient taking differently: Take 20 mEq by mouth every other day. ) 90 tablet 3 Taking  . tamsulosin (FLOMAX) 0.4 MG CAPS capsule Take 0.4 mg by mouth daily after supper.     . traMADol (ULTRAM) 50 MG tablet Take 50 mg by mouth every 12 (twelve) hours as needed for severe pain.    Taking  . vitamin C (ASCORBIC ACID) 500 MG tablet Take 500 mg by mouth daily.      Allergies  Allergen Reactions  . Cocaine Hcl Other (See Comments)    VOMITING AND TACHYCARDIA  . Sudafed [Pseudoephedrine Hcl] Other (See Comments)    TACHYCARDIA    Social History   Tobacco Use  . Smoking status: Never Smoker  . Smokeless tobacco: Never Used  Substance Use Topics  . Alcohol use: Never    Frequency: Never    No family history on file.   Review of Systems  Musculoskeletal: Positive for joint pain.  All other systems reviewed and are negative.   Objective:  Physical Exam  Constitutional: He is oriented to person, place, and time. He appears well-developed and well-nourished.  HENT:  Head: Normocephalic and atraumatic.  Eyes: Pupils are equal, round, and reactive to light.  Neck: Normal range of motion.  Cardiovascular: Normal rate.  Respiratory: Effort normal.  GI: Soft.  Musculoskeletal:     Right knee: He exhibits decreased range of motion, swelling, effusion, abnormal alignment, bony tenderness and abnormal meniscus. Tenderness found. Medial joint line and lateral joint line tenderness noted.  Neurological: He is alert and oriented to person, place, and time.  Skin: Skin is warm and dry.   Psychiatric: He has a normal mood and affect.    Vital signs in last 24 hours:    Labs:   Estimated body mass index is 29.62 kg/m as calculated from the following:   Height as of 02/18/18: 5\' 9"  (1.753 m).   Weight as of 02/18/18: 91 kg.   Imaging Review Plain radiographs demonstrate severe degenerative joint disease of the right knee(s). The overall alignment ismild varus. The bone quality appears to be good for age and reported activity level.   Preoperative templating of the joint replacement has been completed, documented, and submitted to the Operating Room personnel in order to optimize intra-operative equipment management.   Anticipated LOS equal to  or greater than 2 midnights due to - Age 55 and older with one or more of the following:  - Obesity  - Expected need for hospital services (PT, OT, Nursing) required for safe  discharge  - Anticipated need for postoperative skilled nursing care or inpatient rehab  - Active co-morbidities: None OR   - Unanticipated findings during/Post Surgery: None  - Patient is a high risk of re-admission due to: None     Assessment/Plan:  End stage arthritis, right knee   The patient history, physical examination, clinical judgment of the provider and imaging studies are consistent with end stage degenerative joint disease of the right knee(s) and total knee arthroplasty is deemed medically necessary. The treatment options including medical management, injection therapy arthroscopy and arthroplasty were discussed at length. The risks and benefits of total knee arthroplasty were presented and reviewed. The risks due to aseptic loosening, infection, stiffness, patella tracking problems, thromboembolic complications and other imponderables were discussed. The patient acknowledged the explanation, agreed to proceed with the plan and consent was signed. Patient is being admitted for inpatient treatment for surgery, pain control, PT, OT,  prophylactic antibiotics, VTE prophylaxis, progressive ambulation and ADL's and discharge planning. The patient is planning to be discharged to home vs skilled nursing depending on his progress post-op.

## 2018-02-28 NOTE — Anesthesia Procedure Notes (Signed)
Spinal  Patient location during procedure: OR Start time: 02/28/2018 10:12 AM End time: 02/28/2018 10:17 AM Staffing Anesthesiologist: Effie Berkshire, MD Resident/CRNA: Glory Buff, CRNA Performed: resident/CRNA  Preanesthetic Checklist Completed: patient identified, site marked, surgical consent, pre-op evaluation, timeout performed, IV checked, risks and benefits discussed and monitors and equipment checked Spinal Block Patient position: sitting Prep: DuraPrep Patient monitoring: heart rate, continuous pulse ox and blood pressure Approach: midline Location: L2-3 Injection technique: single-shot Needle Needle type: Pencan  Needle gauge: 24 G Needle length: 9 cm Needle insertion depth: 9 cm Assessment Sensory level: T6 Additional Notes Kit expiration date checked and verified.  Sterile prep and drape, skin local with 1% lidocaine, stick x 1, + CSF, tolerated procedure well.

## 2018-02-28 NOTE — Anesthesia Procedure Notes (Signed)
Date/Time: 02/28/2018 10:06 AM Performed by: Thornell Mule, CRNA Oxygen Delivery Method: Nasal cannula

## 2018-02-28 NOTE — Progress Notes (Signed)
Assisted Dr. Hollis with right, ultrasound guided, adductor canal block. Side rails up, monitors on throughout procedure. See vital signs in flow sheet. Tolerated Procedure well.  

## 2018-02-28 NOTE — Anesthesia Procedure Notes (Signed)
Anesthesia Regional Block: Adductor canal block   Pre-Anesthetic Checklist: ,, timeout performed, Correct Patient, Correct Site, Correct Laterality, Correct Procedure, Correct Position, site marked, Risks and benefits discussed,  Surgical consent,  Pre-op evaluation,  At surgeon's request and post-op pain management  Laterality: Right  Prep: chloraprep       Needles:  Injection technique: Single-shot  Needle Type: Echogenic Stimulator Needle     Needle Length: 9cm  Needle Gauge: 21     Additional Needles:   Procedures:,,,, ultrasound used (permanent image in chart),,,,  Narrative:  Start time: 02/28/2018 9:40 AM End time: 02/28/2018 9:50 AM Injection made incrementally with aspirations every 5 mL.  Performed by: Personally  Anesthesiologist: Shelton Silvas, MD  Additional Notes: Patient tolerated the procedure well. Local anesthetic introduced in an incremental fashion under minimal resistance after negative aspirations. No paresthesias were elicited. After completion of the procedure, no acute issues were identified and patient continued to be monitored by RN.

## 2018-03-01 LAB — BASIC METABOLIC PANEL
Anion gap: 12 (ref 5–15)
BUN: 16 mg/dL (ref 8–23)
CO2: 20 mmol/L — ABNORMAL LOW (ref 22–32)
Calcium: 8.3 mg/dL — ABNORMAL LOW (ref 8.9–10.3)
Chloride: 106 mmol/L (ref 98–111)
Creatinine, Ser: 1.1 mg/dL (ref 0.61–1.24)
GFR calc Af Amer: >60 mL/min (ref >60)
GFR calc non Af Amer: >60 mL/min (ref >60)
Glucose, Bld: 161 mg/dL — ABNORMAL HIGH (ref 70–99)
Potassium: 3.6 mmol/L (ref 3.5–5.1)
SODIUM: 138 mmol/L (ref 135–145)

## 2018-03-01 LAB — CBC
HCT: 33.5 % — ABNORMAL LOW (ref 39.0–52.0)
Hemoglobin: 10.2 g/dL — ABNORMAL LOW (ref 13.0–17.0)
MCH: 29.7 pg (ref 26.0–34.0)
MCHC: 30.4 g/dL (ref 30.0–36.0)
MCV: 97.4 fL (ref 80.0–100.0)
Platelets: 265 10*3/uL (ref 150–400)
RBC: 3.44 MIL/uL — ABNORMAL LOW (ref 4.22–5.81)
RDW: 14.9 % (ref 11.5–15.5)
WBC: 15.4 10*3/uL — ABNORMAL HIGH (ref 4.0–10.5)
nRBC: 0 % (ref 0.0–0.2)

## 2018-03-01 NOTE — Discharge Instructions (Signed)

## 2018-03-01 NOTE — Progress Notes (Signed)
Physical Therapy Treatment Patient Details Name: Danny Nelson MRN: [id number] DOB: October 16, 1929 Today's Date: 03/01/2018    History of Present Illness Pt s/p R TKR and with hx of CAD, a-fib, dysrythmia     PT Comments    Pt continues very cooperative and with marked improvement in activity tolerance noted 2* improved pain control.   Follow Up Recommendations  SNF     Equipment Recommendations  None recommended by PT    Recommendations for Other Services       Precautions / Restrictions Precautions Precautions: Knee;Fall Required Braces or Orthoses: Knee Immobilizer - Right Knee Immobilizer - Right: Discontinue once straight leg raise with < 10 degree lag Restrictions Weight Bearing Restrictions: No Other Position/Activity Restrictions: WBAT    Mobility  Bed Mobility           Sit to supine: Min assist   General bed mobility comments: assist for RLE  Transfers Overall transfer level: Needs assistance Equipment used: Rolling walker (2 wheeled) Transfers: Sit to/from Stand Sit to Stand: Min assist         General transfer comment: light steadying assistance and cues for UE/LE placement  Ambulation/Gait Ambulation/Gait assistance: Min assist Gait Distance (Feet): 60 Feet Assistive device: Rolling walker (2 wheeled) Gait Pattern/deviations: Step-to pattern;Decreased step length - right;Decreased step length - left;Shuffle;Trunk flexed Gait velocity: decr   General Gait Details: cues for sequence, posture and position from RW; pain limited   Stairs             Wheelchair Mobility    Modified Rankin (Stroke Patients Only)       Balance Overall balance assessment: Mild deficits observed, not formally tested                                          Cognition Arousal/Alertness: Awake/alert Behavior During Therapy: WFL for tasks assessed/performed Overall Cognitive Status: Within Functional Limits for tasks assessed                                         Exercises      General Comments        Pertinent Vitals/Pain Pain Assessment: 0-10 Pain Score: 5  Pain Location: R knee Pain Descriptors / Indicators: Aching;Sore Pain Intervention(s): Limited activity within patient's tolerance;Monitored during session;Premedicated before session;Ice applied    Home Living Family/patient expects to be discharged to:: Skilled nursing facility Living Arrangements: Alone                  Prior Function Level of Independence: Independent          PT Goals (current goals can now be found in the care plan section) Acute Rehab PT Goals Patient Stated Goal: Regain IND PT Goal Formulation: With patient Time For Goal Achievement: 03/14/18 Potential to Achieve Goals: Good Progress towards PT goals: Progressing toward goals    Frequency    7X/week      PT Plan Current plan remains appropriate    Co-evaluation              AM-PAC PT "6 Clicks" Mobility   Outcome Measure  Help needed turning from your back to your side while in a flat bed without using bedrails?: A Little Help needed moving from lying on your back to sitting  on the side of a flat bed without using bedrails?: A Little Help needed moving to and from a bed to a chair (including a wheelchair)?: A Little Help needed standing up from a chair using your arms (e.g., wheelchair or bedside chair)?: A Little Help needed to walk in hospital room?: A Little Help needed climbing 3-5 steps with a railing? : A Lot 6 Click Score: 17    End of Session Equipment Utilized During Treatment: Gait belt;Right knee immobilizer Activity Tolerance: Patient tolerated treatment well Patient left: Other (comment)(sitting EOB with OT) Nurse Communication: Mobility status PT Visit Diagnosis: Difficulty in walking, not elsewhere classified (R26.2)     Time: 8101-7510 PT Time Calculation (min) (ACUTE ONLY): 24 min  Charges:  $Gait  Training: 23-37 mins                     Mauro Kaufmann PT Acute Rehabilitation Services Pager [phone] Office [phone]    Danny Nelson 03/01/2018, 4:05 PM

## 2018-03-01 NOTE — Care Management Note (Signed)
Case Management Note  Patient Details  Name: Danny Nelson MRN: [id number] Date of Birth: Sep 30, 1929  Subjective/Objective:   S/p R TKR                 Action/Plan: Spoke to pt and plan is for SNF at [facility] on Monday. CSW referral for SNF. States his son can provide transportation.   Expected Discharge Date:                  Expected Discharge Plan:  Skilled Nursing Facility  In-House Referral:  Clinical Social Work  Discharge planning Services  CM Consult  Post Acute Care Choice:  NA Choice offered to:  NA  DME Arranged:  N/A DME Agency:  NA  HH Arranged:  NA HH Agency:  NA  Status of Service:  Completed, signed off  If discussed at Long Length of Stay Meetings, dates discussed:    Additional Comments:  Elliot Cousin, RN 03/01/2018, 10:25 AM

## 2018-03-01 NOTE — Progress Notes (Signed)
Physical Therapy Treatment Patient Details Name: Danny Nelson MRN: [id number] DOB: April 22, 1929 Today's Date: 03/01/2018    History of Present Illness Pt s/p R TKR and with hx of CAD, a-fib, dysrythmia     PT Comments    Pt continues very cooperative but ltd this date by pain and nausea.     Follow Up Recommendations  SNF     Equipment Recommendations  None recommended by PT    Recommendations for Other Services       Precautions / Restrictions Precautions Precautions: Knee;Fall Required Braces or Orthoses: Knee Immobilizer - Right Knee Immobilizer - Right: Discontinue once straight leg raise with < 10 degree lag Restrictions Weight Bearing Restrictions: No Other Position/Activity Restrictions: WBAT    Mobility  Bed Mobility Overal bed mobility: Needs Assistance Bed Mobility: Supine to Sit     Supine to sit: Min assist     General bed mobility comments: cues for sequence and use of L LE to self assist  Transfers Overall transfer level: Needs assistance Equipment used: Rolling walker (2 wheeled) Transfers: Sit to/from Stand Sit to Stand: Min assist;Mod assist         General transfer comment: cues for LE management and use of UEs to self assist  Ambulation/Gait Ambulation/Gait assistance: Min assist Gait Distance (Feet): 5 Feet Assistive device: Rolling walker (2 wheeled) Gait Pattern/deviations: Step-to pattern;Decreased step length - right;Decreased step length - left;Shuffle;Trunk flexed Gait velocity: decr   General Gait Details: cues for sequence, posture and position from RW; pain limited   Stairs             Wheelchair Mobility    Modified Rankin (Stroke Patients Only)       Balance Overall balance assessment: Mild deficits observed, not formally tested                                          Cognition Arousal/Alertness: Awake/alert Behavior During Therapy: WFL for tasks assessed/performed Overall Cognitive  Status: Within Functional Limits for tasks assessed                                        Exercises Total Joint Exercises Ankle Circles/Pumps: AROM;Both;15 reps;Supine Quad Sets: AROM;Both;10 reps;Supine Heel Slides: AAROM;Right;10 reps;Supine Straight Leg Raises: AAROM;Right;10 reps;Supine    General Comments        Pertinent Vitals/Pain Pain Assessment: 0-10 Pain Score: 8  Pain Location: R knee Pain Descriptors / Indicators: Aching;Sore Pain Intervention(s): Limited activity within patient's tolerance;Monitored during session;Premedicated before session;Ice applied    Home Living                      Prior Function            PT Goals (current goals can now be found in the care plan section) Acute Rehab PT Goals Patient Stated Goal: Regain IND PT Goal Formulation: With patient Time For Goal Achievement: 03/14/18 Potential to Achieve Goals: Good Progress towards PT goals: Not progressing toward goals - comment(pain limited)    Frequency    7X/week      PT Plan Current plan remains appropriate    Co-evaluation              AM-PAC PT "6 Clicks" Mobility   Outcome Measure  Help  needed turning from your back to your side while in a flat bed without using bedrails?: A Little Help needed moving from lying on your back to sitting on the side of a flat bed without using bedrails?: A Little Help needed moving to and from a bed to a chair (including a wheelchair)?: A Little Help needed standing up from a chair using your arms (e.g., wheelchair or bedside chair)?: A Little Help needed to walk in hospital room?: A Lot Help needed climbing 3-5 steps with a railing? : A Lot 6 Click Score: 16    End of Session Equipment Utilized During Treatment: Gait belt;Right knee immobilizer Activity Tolerance: Patient tolerated treatment well Patient left: in chair;with call bell/phone within reach;with family/visitor present Nurse Communication:  Mobility status PT Visit Diagnosis: Difficulty in walking, not elsewhere classified (R26.2)     Time: [id number] PT Time Calculation (min) (ACUTE ONLY): 34 min  Charges:  $Gait Training: 8-22 mins $Therapeutic Exercise: 8-22 mins                     Mauro KaufmannHunter Tashica Provencio PT Acute Rehabilitation Services Pager [phone] Office [phone]    Dontay Harm 03/01/2018, 12:40 PM

## 2018-03-01 NOTE — Evaluation (Signed)
Occupational Therapy Evaluation Patient Details Name: Danny Nelson MRN: [id number] DOB: 1929/12/23 Today's Date: 03/01/2018    History of Present Illness Pt s/p R TKR and with hx of CAD, a-fib, dysrythmia    Clinical Impression   This 85 year old man was admitted for the above. At baseline, he is independent with adls.  He currently needs min A for transfers and mod to max A for LB adls.  Pt lives alone and will benefit from continued OT in acute setting as well as OT at SNF. Goals in acute are for supervision level    Follow Up Recommendations  SNF    Equipment Recommendations  3 in 1 bedside commode    Recommendations for Other Services       Precautions / Restrictions Precautions Precautions: Knee;Fall Required Braces or Orthoses: Knee Immobilizer - Right Knee Immobilizer - Right: Discontinue once straight leg raise with < 10 degree lag Restrictions Weight Bearing Restrictions: No Other Position/Activity Restrictions: WBAT      Mobility Bed Mobility Overal bed mobility: Needs Assistance Bed Mobility: Supine to Sit      Sit to supine: Min assist   General bed mobility comments: assist for RLE  Transfers Overall transfer level: Needs assistance Equipment used: Rolling walker (2 wheeled) Transfers: Sit to/from Stand Sit to Stand: Min assist         General transfer comment: light steadying assistance and cues for UE/LE placement    Balance Overall balance assessment: Mild deficits observed, not formally tested                                         ADL either performed or assessed with clinical judgement   ADL Overall ADL's : Needs assistance/impaired Eating/Feeding: Independent   Grooming: Set up;Sitting   Upper Body Bathing: Set up;Sitting   Lower Body Bathing: Moderate assistance;Sit to/from stand   Upper Body Dressing : Set up;Sitting   Lower Body Dressing: Maximal assistance;Sit to/from stand Lower Body Dressing Details  (indicate cue type and reason): donned underwear from EOB with mod A Toilet Transfer: Minimal assistance;Stand-pivot;RW(bed, simulated toilet)   Toileting- Clothing Manipulation and Hygiene: Minimal assistance;Sit to/from stand         General ADL Comments: educated on reacher, but did not use it this session.  Pt with many visitors; anxious to visit with them. Reviewed knee precautions     Vision         Perception     Praxis      Pertinent Vitals/Pain Pain Assessment: 0-10 Pain Score: 6  Pain Location: R knee Pain Descriptors / Indicators: Aching;Sore Pain Intervention(s): Limited activity within patient's tolerance;Monitored during session;Premedicated before session;Repositioned     Hand Dominance     Extremity/Trunk Assessment Upper Extremity Assessment Upper Extremity Assessment: Overall WFL for tasks assessed           Communication Communication Communication: No difficulties   Cognition Arousal/Alertness: Awake/alert Behavior During Therapy: WFL for tasks assessed/performed Overall Cognitive Status: Within Functional Limits for tasks assessed                                     General Comments       Exercises    Shoulder Instructions      Home Living Family/patient expects to be discharged to:: Skilled nursing  facility Living Arrangements: Alone                                      Prior Functioning/Environment Level of Independence: Independent                 OT Problem List: Decreased strength;Decreased activity tolerance;Pain;Decreased knowledge of use of DME or AE;Decreased knowledge of precautions      OT Treatment/Interventions: Self-care/ADL training;DME and/or AE instruction;Patient/family education;Therapeutic activities    OT Goals(Current goals can be found in the care plan section) Acute Rehab OT Goals Patient Stated Goal: Regain IND OT Goal Formulation: With patient Time For Goal  Achievement: 03/15/18 Potential to Achieve Goals: Good ADL Goals Pt Will Perform Grooming: with supervision;standing Pt Will Transfer to Toilet: with supervision;ambulating;bedside commode Pt Will Perform Toileting - Clothing Manipulation and hygiene: with supervision;sit to/from stand Additional ADL Goal #1: pt will complete LB adls with supervison and AE  OT Frequency: Min 2X/week   Barriers to D/C:            Co-evaluation              AM-PAC OT "6 Clicks" Daily Activity     Outcome Measure Help from another person eating meals?: None Help from another person taking care of personal grooming?: None Help from another person toileting, which includes using toliet, bedpan, or urinal?: None Help from another person bathing (including washing, rinsing, drying)?: A Lot Help from another person to put on and taking off regular upper body clothing?: None Help from another person to put on and taking off regular lower body clothing?: A Lot 6 Click Score: 20   End of Session    Activity Tolerance: Patient tolerated treatment well Patient left: in bed;with call bell/phone within reach;with bed alarm set  OT Visit Diagnosis: Pain Pain - Right/Left: Right Pain - part of body: Knee                Time: 1093-2355 OT Time Calculation (min): 9 min Charges:  OT General Charges $OT Visit: 1 Visit OT Evaluation $OT Eval Low Complexity: 1 Low  Marica Otter, OTR/L Acute Rehabilitation Services [phone] [facility] pager [phone] office 03/01/2018  Hakiem Malizia 03/01/2018, 2:17 PM

## 2018-03-01 NOTE — Clinical Social Work Note (Signed)
Clinical Social Work Assessment  Patient Details  Name: Danny Nelson MRN: [id number] Date of Birth: 08/02/1929  Date of referral:  03/01/18               Reason for consult:  Facility Placement                Permission sought to share information with:  Facility Contact Representative, Family Supports Permission granted to share information::  Yes, Verbal Permission Granted  Name::     Danny Nelson  Agency::  [facility]  Relationship::  Son  Contact Information:     Housing/Transportation Living arrangements for the past 2 months:  Single Family Home Source of Information:  Patient Patient Interpreter Needed:  None Criminal Activity/Legal Involvement Pertinent to Current Situation/Hospitalization:  No - Comment as needed Significant Relationships:  Adult Children Lives with:  Self Do you feel safe going back to the place where you live?  Yes Need for family participation in patient care:  Yes (Comment)  Care giving concerns: Patient is being admitted for right total knee arthroplasty.  No care giving concerns at the time of assessment.   Social Worker assessment / plan:  LCSW consulted for SNF placement.   LCSW met with patient at bedside. Patient reports he is pain today and has been nauseous. Patient has pre arranged to go to [facility] for rehab post op. LCSW explained role and reason for visit. Patient expressed understanding.   Patient gives LCSW permission to speak with his son and [facility].   Patient is from home alone and is independent at base line. Patient asked questions regarding insurance covering EMS transportation to SNF. LCSW explained the process.   Patient under the impression that he will dc Monday. LCSW explained that the facility will have to get pre auth from [employer] and that they will need out PT notes to complete that auth. Dc may not happen Monday due to pt needing insurance auth and offices closed on the weekend.   LCSW obtained patient SS # to complete referral  process and obtain PASRR number.   PLAN: Patient to dc to [facility] for rehab. [facility] will start auth Monday morning.   Employment status:  Retired Insurance information:  Managed Medicare PT Recommendations:  Skilled Nursing Facility Information / Referral to community resources:     Patient/Family's Response to care:  Patient thankful for LCSW visit.   Patient/Family's Understanding of and Emotional Response to Diagnosis, Current Treatment, and Prognosis:  Patient expressed understanding for the need of rehab post op. Patient reports that he has been proactive in his dc plan and has visited [facility] prior to his procedure.   Emotional Assessment Appearance:  Appears stated age Attitude/Demeanor/Rapport:  Engaged Affect (typically observed):  Accepting, Calm, Pleasant Orientation:  Oriented to Self, Oriented to Place, Oriented to  Time, Oriented to Situation Alcohol / Substance use:  Not Applicable Psych involvement (Current and /or in the community):  No (Comment)  Discharge Needs  Concerns to be addressed:  No discharge needs identified Readmission within the last 30 days:  No Current discharge risk:  None Barriers to Discharge:  Continued Medical Work up, Insurance Authorization   Bernette Jones, LCSW 03/01/2018, 12:35 PM  

## 2018-03-01 NOTE — NC FL2 (Signed)
[place] MEDICAID FL2 LEVEL OF CARE SCREENING TOOL     IDENTIFICATION  Patient Name: Danny Nelson Birthdate: 1929-08-19 Sex: male Admission Date (Current Location): 02/28/2018  [facility] and [place] Number:  Producer, television/film/video and Address:  [facility],  [address] [place], [place] [address]      Provider Number: [id number]  Attending Physician Name and Address:  Kathryne Hitch  Relative Name and Phone Number:       Current Level of Care: Hospital Recommended Level of Care: Skilled Nursing Facility Prior Approval Number:    Date Approved/Denied:   PASRR Number:   [id number]   Discharge Plan: SNF    Current Diagnoses: Patient Active Problem List   Diagnosis Date Noted  . Unilateral primary osteoarthritis, right knee 02/28/2018  . Status post total knee replacement, right 02/28/2018  . Unilateral primary osteoarthritis, left knee 12/10/2017    Orientation RESPIRATION BLADDER Height & Weight     Self, Time, Situation, Place  Normal Continent Weight: 200 lb 9.9 oz (91 kg) Height:  5\' 9"  (175.3 cm)  BEHAVIORAL SYMPTOMS/MOOD NEUROLOGICAL BOWEL NUTRITION STATUS      Continent Diet(See dc summary)  AMBULATORY STATUS COMMUNICATION OF NEEDS Skin     Verbally Other (Comment)(right leg, compression wrap)                       Personal Care Assistance Level of Assistance              Functional Limitations Info  Sight, Hearing, Speech Sight Info: Adequate Hearing Info: Adequate Speech Info: Adequate    SPECIAL CARE FACTORS FREQUENCY  PT (By licensed PT), OT (By licensed OT)     PT Frequency: 5x/week              Contractures Contractures Info: Not present    Additional Factors Info  Code Status, Allergies Code Status Info: Full Allergies Info:  Cocaine Hcl, Sudafed Pseudoephedrine Hcl           Current Medications (03/01/2018):  This is the current hospital active medication list Current Facility-Administered  Medications  Medication Dose Route Frequency Provider Last Rate Last Dose  . 0.9 %  sodium chloride infusion   Intravenous Continuous Kathryne Hitch, MD 75 mL/hr at 03/01/18 [phone]    . acetaminophen (TYLENOL) tablet 325-650 mg  325-650 mg Oral Q6H PRN Kathryne Hitch, MD   650 mg at 03/01/18 1037  . alum & mag hydroxide-simeth (MAALOX/MYLANTA) 200-200-20 MG/5ML suspension 30 mL  30 mL Oral Q4H PRN Kathryne Hitch, MD      . apixaban Everlene Balls) tablet 5 mg  5 mg Oral BID Kathryne Hitch, MD   5 mg at 03/01/18 1037  . atorvastatin (LIPITOR) tablet 20 mg  20 mg Oral QHS Kathryne Hitch, MD   20 mg at 02/28/18 2139  . cholecalciferol (VITAMIN D3) tablet 2,000 Units  2,000 Units Oral Daily Kathryne Hitch, MD   2,000 Units at 03/01/18 1036  . diphenhydrAMINE (BENADRYL) 12.5 MG/5ML elixir 12.5-25 mg  12.5-25 mg Oral Q4H PRN Kathryne Hitch, MD   12.5 mg at 03/01/18 0154  . docusate sodium (COLACE) capsule 100 mg  100 mg Oral BID Kathryne Hitch, MD   100 mg at 03/01/18 1037  . finasteride (PROSCAR) tablet 5 mg  5 mg Oral Daily Kathryne Hitch, MD   5 mg at 03/01/18 1037  . furosemide (LASIX) tablet 40 mg  40 mg  Oral Moreen FowlerQODAY Blackman, Christopher Y, MD      . gabapentin (NEURONTIN) capsule 100 mg  100 mg Oral Daily Kathryne HitchBlackman, Christopher Y, MD   100 mg at 03/01/18 1037  . gabapentin (NEURONTIN) capsule 200 mg  200 mg Oral QHS Kathryne HitchBlackman, Christopher Y, MD   200 mg at 02/28/18 2140  . HYDROmorphone (DILAUDID) injection 0.5-1 mg  0.5-1 mg Intravenous Q2H PRN Kathryne HitchBlackman, Christopher Y, MD      . levothyroxine (SYNTHROID, LEVOTHROID) tablet 50 mcg  50 mcg Oral QAC breakfast Kathryne HitchBlackman, Christopher Y, MD   50 mcg at 03/01/18 [id number]  . loratadine (CLARITIN) tablet 10 mg  10 mg Oral Daily Kathryne HitchBlackman, Christopher Y, MD   10 mg at 03/01/18 1037  . losartan (COZAAR) tablet 50 mg  50 mg Oral Daily Kathryne HitchBlackman, Christopher Y, MD   50 mg at 03/01/18 1037  .  menthol-cetylpyridinium (CEPACOL) lozenge 3 mg  1 lozenge Oral PRN Kathryne HitchBlackman, Christopher Y, MD       Or  . phenol (CHLORASEPTIC) mouth spray 1 spray  1 spray Mouth/Throat PRN Kathryne HitchBlackman, Christopher Y, MD      . methocarbamol (ROBAXIN) tablet 500 mg  500 mg Oral Q6H PRN Kathryne HitchBlackman, Christopher Y, MD   500 mg at 03/01/18 1037   Or  . methocarbamol (ROBAXIN) 500 mg in dextrose 5 % 50 mL IVPB  500 mg Intravenous Q6H PRN Kathryne HitchBlackman, Christopher Y, MD 100 mL/hr at 02/28/18 1233 500 mg at 02/28/18 1233  . metoCLOPramide (REGLAN) tablet 5-10 mg  5-10 mg Oral Q8H PRN Kathryne HitchBlackman, Christopher Y, MD       Or  . metoCLOPramide (REGLAN) injection 5-10 mg  5-10 mg Intravenous Q8H PRN Kathryne HitchBlackman, Christopher Y, MD      . metoprolol succinate (TOPROL-XL) 24 hr tablet 25 mg  25 mg Oral Daily Kathryne HitchBlackman, Christopher Y, MD   25 mg at 03/01/18 1036  . multivitamin with minerals tablet 1 tablet  1 tablet Oral Daily Kathryne HitchBlackman, Christopher Y, MD   1 tablet at 03/01/18 1037  . nitroGLYCERIN (NITROSTAT) SL tablet 0.4 mg  0.4 mg Sublingual Q5 min PRN Kathryne HitchBlackman, Christopher Y, MD      . ondansetron [facility](ZOFRAN) tablet 4 mg  4 mg Oral Q6H PRN Kathryne HitchBlackman, Christopher Y, MD       Or  . ondansetron [facility](ZOFRAN) injection 4 mg  4 mg Intravenous Q6H PRN Kathryne HitchBlackman, Christopher Y, MD      . oxyCODONE (Oxy IR/ROXICODONE) immediate release tablet 10-15 mg  10-15 mg Oral Q4H PRN Kathryne HitchBlackman, Christopher Y, MD   10 mg at 03/01/18 0152  . oxyCODONE (Oxy IR/ROXICODONE) immediate release tablet 5-10 mg  5-10 mg Oral Q4H PRN Kathryne HitchBlackman, Christopher Y, MD   5 mg at 03/01/18 1037  . pantoprazole (PROTONIX) EC tablet 40 mg  40 mg Oral Daily Kathryne HitchBlackman, Christopher Y, MD   40 mg at 03/01/18 1037  . polyethylene glycol (MIRALAX / GLYCOLAX) packet 17 g  17 g Oral Daily PRN Kathryne HitchBlackman, Christopher Y, MD      . potassium chloride SA (K-DUR,KLOR-CON) CR tablet 20 mEq  20 mEq Oral Moreen FowlerQODAY Blackman, Christopher Y, MD      . tamsulosin [facility](FLOMAX) capsule 0.4 mg  0.4 mg Oral QPC supper  Kathryne HitchBlackman, Christopher Y, MD   0.4 mg at 02/28/18 1823  . vitamin C (ASCORBIC ACID) tablet 500 mg  500 mg Oral Daily Kathryne HitchBlackman, Christopher Y, MD   500 mg at 03/01/18 1036     Discharge Medications: Please see discharge  summary for a list of discharge medications.  Relevant Imaging Results:  Relevant Lab Results:   Additional Information ssn: [id number]  Coralyn Helling, LCSW

## 2018-03-01 NOTE — Progress Notes (Signed)
Subjective: 1 Day Post-Op Procedure(s) (LRB): RIGHT TOTAL KNEE ARTHROPLASTY (Right) Patient reports pain as moderate.    Objective: Vital signs in last 24 hours: Temp:  [97.4 F (36.3 C)-98.8 F (37.1 C)] 97.6 F (36.4 C) (01/11 1325) Pulse Rate:  [70-101] 100 (01/11 1325) Resp:  [12-20] 16 (01/11 1325) BP: (135-170)/(77-99) 146/85 (01/11 1325) SpO2:  [93 %-100 %] 98 % (01/11 1325) Weight:  [91 kg] 91 kg (01/10 1425)  Intake/Output from previous day: 01/10 0701 - 01/11 0700 In: 2861 [P.O.:610; I.V.:2043.4; IV Piggyback:207.6] Out: 0938 [HWEXH:3716; Blood:50] Intake/Output this shift: Total I/O In: 350.1 [P.O.:120; I.V.:230.1] Out: -   Recent Labs    03/01/18 0933  HGB 10.2*   Recent Labs    03/01/18 0933  WBC 15.4*  RBC 3.44*  HCT 33.5*  PLT 265   Recent Labs    03/01/18 0933  NA 138  K 3.6  CL 106  CO2 20*  BUN 16  CREATININE 1.10  GLUCOSE 161*  CALCIUM 8.3*   No results for input(s): LABPT, INR in the last 72 hours.  Sensation intact distally Intact pulses distally Dorsiflexion/Plantar flexion intact Incision: dressing C/D/I No cellulitis present Compartment soft  Anticipated LOS equal to or greater than 2 midnights due to - Age 58 and older with one or more of the following:  - Obesity  - Expected need for hospital services (PT, OT, Nursing) required for safe  discharge  - Anticipated need for postoperative skilled nursing care or inpatient rehab  - Active co-morbidities: Coronary Artery Disease OR   - Unanticipated findings during/Post Surgery: None  - Patient is a high risk of re-admission due to: None   Assessment/Plan: 1 Day Post-Op Procedure(s) (LRB): RIGHT TOTAL KNEE ARTHROPLASTY (Right) Up with therapy Discharge to SNF Monday    Kathryne Hitch 03/01/2018, 1:40 PM

## 2018-03-02 LAB — CBC
HEMATOCRIT: 29.8 % — AB (ref 39.0–52.0)
HEMOGLOBIN: 9.3 g/dL — AB (ref 13.0–17.0)
MCH: 29.6 pg (ref 26.0–34.0)
MCHC: 31.2 g/dL (ref 30.0–36.0)
MCV: 94.9 fL (ref 80.0–100.0)
Platelets: 215 10*3/uL (ref 150–400)
RBC: 3.14 MIL/uL — ABNORMAL LOW (ref 4.22–5.81)
RDW: 14.9 % (ref 11.5–15.5)
WBC: 15.6 10*3/uL — ABNORMAL HIGH (ref 4.0–10.5)
nRBC: 0 % (ref 0.0–0.2)

## 2018-03-02 MED ORDER — BISACODYL 10 MG RE SUPP: 10.0000 mg | Freq: Every day | RECTAL | Status: DC | PRN

## 2018-03-02 NOTE — Progress Notes (Signed)
   Subjective: 2 Days Post-Op Procedure(s) (LRB): RIGHT TOTAL KNEE ARTHROPLASTY (Right) Patient reports pain as moderate.  aquacell filled with blood and running out. On eliquis due to afib. Nauseated taking 2 oxy at a time.  Objective: Vital signs in last 24 hours: Temp:  [97.6 F (36.4 C)-98.8 F (37.1 C)] 98.4 F (36.9 C) (01/12 0457) Pulse Rate:  [98-104] 104 (01/12 0457) Resp:  [16-18] 17 (01/12 0457) BP: (133-173)/(84-90) 133/84 (01/12 0457) SpO2:  [92 %-99 %] 92 % (01/12 0457)  Intake/Output from previous day: 01/11 0701 - 01/12 0700 In: 1165.1 [P.O.:660; I.V.:505.1] Out: 400 [Urine:400] Intake/Output this shift: Total I/O In: 360 [P.O.:360] Out: 300 [Urine:300]  Recent Labs    03/01/18 0933 03/02/18 0319  HGB 10.2* 9.3*   Recent Labs    03/01/18 0933 03/02/18 0319  WBC 15.4* 15.6*  RBC 3.44* 3.14*  HCT 33.5* 29.8*  PLT 265 215   Recent Labs    03/01/18 0933  NA 138  K 3.6  CL 106  CO2 20*  BUN 16  CREATININE 1.10  GLUCOSE 161*  CALCIUM 8.3*   No results for input(s): LABPT, INR in the last 72 hours.  Neurologically intact blood draining out of dressing.  No results found.  Assessment/Plan: 2 Days Post-Op Procedure(s) (LRB): RIGHT TOTAL KNEE ARTHROPLASTY (Right) Up with therapy  I removed his dressing aquacell and applied 4X4/s and ABD and ace wrap since inferior part of incision still draining blood. New aquacell at bedside for application in AM if oozing stops.   Danny Nelson 03/02/2018, 9:34 AM

## 2018-03-02 NOTE — Progress Notes (Signed)
Physical Therapy Treatment Patient Details Name: Danny Nelson MRN: [id number] DOB: May 11, 1929 Today's Date: 03/02/2018    History of Present Illness Pt s/p R TKR and with hx of CAD, a-fib, dysrythmia     PT Comments    Pt  Progressing toward goals; continue to recommend SNF post acute   Follow Up Recommendations  SNF     Equipment Recommendations  None recommended by PT    Recommendations for Other Services       Precautions / Restrictions Precautions Precautions: Knee;Fall Required Braces or Orthoses: Knee Immobilizer - Right Knee Immobilizer - Right: Discontinue once straight leg raise with < 10 degree lag Restrictions Weight Bearing Restrictions: No Other Position/Activity Restrictions: WBAT    Mobility  Bed Mobility Overal bed mobility: Needs Assistance Bed Mobility: Supine to Sit     Supine to sit: Min assist;Mod assist     General bed mobility comments: assist for RLE, trunk and  incr time  Transfers Overall transfer level: Needs assistance Equipment used: Rolling walker (2 wheeled) Transfers: Sit to/from Stand Sit to Stand: Min assist         General transfer comment: light steadying assistance and cues for UE/LE placement  Ambulation/Gait Ambulation/Gait assistance: Min assist Gait Distance (Feet): 11 Feet Assistive device: Rolling walker (2 wheeled) Gait Pattern/deviations: Step-to pattern;Antalgic;Decreased weight shift to right     General Gait Details: cues for sequence, posture and position from RW; pain limiting distance    Stairs             Wheelchair Mobility    Modified Rankin (Stroke Patients Only)       Balance                                            Cognition Arousal/Alertness: Awake/alert Behavior During Therapy: WFL for tasks assessed/performed Overall Cognitive Status: Within Functional Limits for tasks assessed                                        Exercises Total  Joint Exercises Ankle Circles/Pumps: AROM;Both;15 reps;Supine Quad Sets: (deferred d/t pain level)    General Comments        Pertinent Vitals/Pain Pain Assessment: 0-10 Pain Score: 6  Pain Location: R knee Pain Descriptors / Indicators: Aching;Sore Pain Intervention(s): Limited activity within patient's tolerance;Monitored during session;RN gave pain meds during session;Ice applied;Repositioned    Home Living                      Prior Function            PT Goals (current goals can now be found in the care plan section) Acute Rehab PT Goals Patient Stated Goal: Regain IND PT Goal Formulation: With patient Time For Goal Achievement: 03/14/18 Potential to Achieve Goals: Good Progress towards PT goals: Progressing toward goals    Frequency    7X/week      PT Plan Current plan remains appropriate    Co-evaluation              AM-PAC PT "6 Clicks" Mobility   Outcome Measure  Help needed turning from your back to your side while in a flat bed without using bedrails?: A Little Help needed moving from lying on your back to sitting  on the side of a flat bed without using bedrails?: A Lot Help needed moving to and from a bed to a chair (including a wheelchair)?: A Little Help needed standing up from a chair using your arms (e.g., wheelchair or bedside chair)?: A Little Help needed to walk in hospital room?: A Little Help needed climbing 3-5 steps with a railing? : A Lot 6 Click Score: 16    End of Session Equipment Utilized During Treatment: Gait belt;Right knee immobilizer Activity Tolerance: Patient tolerated treatment well Patient left: in chair;with call bell/phone within reach Nurse Communication: Mobility status PT Visit Diagnosis: Difficulty in walking, not elsewhere classified (R26.2)     Time: 1422-     Charges:  $Gait Training: 23-37 mins                     Drucilla Chalet, PT  Pager: [phone] Acute Rehab Dept [facility]):  [phone]   03/02/2018    [facility] 03/02/2018, 2:39 PM

## 2018-03-02 NOTE — Plan of Care (Signed)
Pt stable this am though concerned about his surgical wound oozing this  am. Dr. Ophelia Charter applied new dressing to surgical site with RN assist. No needs at this time. No changes needed to care plans.

## 2018-03-02 NOTE — Progress Notes (Signed)
Pt refused cpm today.

## 2018-03-02 NOTE — Anesthesia Postprocedure Evaluation (Signed)
Anesthesia Post Note  Patient: Danny Nelson  Procedure(s) Performed: RIGHT TOTAL KNEE ARTHROPLASTY (Right Knee)     Patient location during evaluation: PACU Anesthesia Type: Spinal Level of consciousness: oriented and awake and alert Pain management: pain level controlled Vital Signs Assessment: post-procedure vital signs reviewed and stable Respiratory status: spontaneous breathing, respiratory function stable and patient connected to nasal cannula oxygen Cardiovascular status: blood pressure returned to baseline and stable Postop Assessment: no headache, no backache, no apparent nausea or vomiting and spinal receding Anesthetic complications: no    Last Vitals:  Vitals:   03/01/18 2055 03/02/18 0457  BP: (!) 173/85 133/84  Pulse: 99 (!) 104  Resp: 16 17  Temp: 36.4 C 36.9 C  SpO2: 99% 92%    Last Pain:  Vitals:   03/02/18 0744  TempSrc:   PainSc: 4                  Shelton Silvas

## 2018-03-03 LAB — CBC
HCT: 28.9 % — ABNORMAL LOW (ref 39.0–52.0)
Hemoglobin: 9 g/dL — ABNORMAL LOW (ref 13.0–17.0)
MCH: 29.3 pg (ref 26.0–34.0)
MCHC: 31.1 g/dL (ref 30.0–36.0)
MCV: 94.1 fL (ref 80.0–100.0)
Platelets: 211 10*3/uL (ref 150–400)
RBC: 3.07 MIL/uL — ABNORMAL LOW (ref 4.22–5.81)
RDW: 14.8 % (ref 11.5–15.5)
WBC: 13.3 10*3/uL — ABNORMAL HIGH (ref 4.0–10.5)
nRBC: 0 % (ref 0.0–0.2)

## 2018-03-03 MED ORDER — OXYCODONE HCL 5 MG PO TABS: 5.0000 mg | ORAL_TABLET | ORAL | 0 refills | Status: DC | PRN

## 2018-03-03 NOTE — Progress Notes (Signed)
Subjective: 3 Days Post-Op Procedure(s) (LRB): RIGHT TOTAL KNEE ARTHROPLASTY (Right) Patient reports pain as moderate.  Acute blood loss anemia from surgery and blood-thinning meds - tolerating well.  Objective: Vital signs in last 24 hours: Temp:  [98.4 F (36.9 C)-99.2 F (37.3 C)] 99.2 F (37.3 C) (01/13 0535) Pulse Rate:  [105-114] 105 (01/13 0535) Resp:  [16] 16 (01/13 0535) BP: (131-141)/(84-89) 131/84 (01/13 0535) SpO2:  [93 %-97 %] 93 % (01/13 0535)  Intake/Output from previous day: 01/12 0701 - 01/13 0700 In: 1450 [P.O.:1450] Out: 2250 [Urine:2250] Intake/Output this shift: No intake/output data recorded.  Recent Labs    03/01/18 0933 03/02/18 0319 03/03/18 0357  HGB 10.2* 9.3* 9.0*   Recent Labs    03/02/18 0319 03/03/18 0357  WBC 15.6* 13.3*  RBC 3.14* 3.07*  HCT 29.8* 28.9*  PLT 215 211   Recent Labs    03/01/18 0933  NA 138  K 3.6  CL 106  CO2 20*  BUN 16  CREATININE 1.10  GLUCOSE 161*  CALCIUM 8.3*   No results for input(s): LABPT, INR in the last 72 hours.  Sensation intact distally Intact pulses distally Dorsiflexion/Plantar flexion intact Incision: scant drainage No cellulitis present Compartment soft  Assessment/Plan: 3 Days Post-Op Procedure(s) (LRB): RIGHT TOTAL KNEE ARTHROPLASTY (Right) Up with therapy Discharge to SNF today.    Kathryne Hitch 03/03/2018, 7:26 AM

## 2018-03-03 NOTE — Discharge Summary (Signed)
Patient ID: Danny Nelson MRN: [id number] DOB/AGE: 1929/04/13 85 y.o.  Admit date: 02/28/2018 Discharge date: 03/03/2018  Admission Diagnoses:  Principal Problem:   Unilateral primary osteoarthritis, right knee Active Problems:   Status post total knee replacement, right   Discharge Diagnoses:  Same  Past Medical History:  Diagnosis Date  . Anticoagulant long-term use   . Anticoagulation goal of INR 2 to 3   . Atrial fibrillation (HCC)   . BPH (benign prostatic hyperplasia)   . CAD (coronary artery disease)   . Complication of anesthesia    for throat surgery, was nasallyngiven cocaine and had a reaction from this -tachycardia and vomiting , then for spinal for another surgery, had problems with voiding-nneded a catheter  . Dyspnea on exertion   . Dysrhythmia    A-Fib  . Hypertension   . Hypothyroidism   . Hypovitaminosis D   . Leg edema   . Osteoarthritis     Surgeries: Procedure(s): RIGHT TOTAL KNEE ARTHROPLASTY on 02/28/2018   Consultants:   Discharged Condition: Improved  Hospital Course: Danny Nelson is an 85 y.o. male who was admitted 02/28/2018 for operative treatment ofUnilateral primary osteoarthritis, right knee. Patient has severe unremitting pain that affects sleep, daily activities, and work/hobbies. After pre-op clearance the patient was taken to the operating room on 02/28/2018 and underwent  Procedure(s): RIGHT TOTAL KNEE ARTHROPLASTY.    Patient was given perioperative antibiotics:  Anti-infectives (From admission, onward)   Start     Dose/Rate Route Frequency Ordered Stop   02/28/18 1600  ceFAZolin (ANCEF) IVPB 1 g/50 mL premix     1 g 100 mL/hr over 30 Minutes Intravenous Every 6 hours 02/28/18 1438 02/28/18 2213   02/28/18 0730  ceFAZolin (ANCEF) IVPB 2g/100 mL premix     2 g 200 mL/hr over 30 Minutes Intravenous On call to O.R. 02/28/18 0726 02/28/18 1048       Patient was given sequential compression devices, early ambulation, and  chemoprophylaxis to prevent DVT.  Patient benefited maximally from hospital stay and there were no complications.    Recent vital signs:  Patient Vitals for the past 24 hrs:  BP Temp Temp src Pulse Resp SpO2  03/03/18 0535 131/84 99.2 F (37.3 C) - (!) 105 16 93 %  03/02/18 2221 (!) 141/89 98.4 F (36.9 C) Oral (!) 106 16 97 %  03/02/18 1357 134/86 99.2 F (37.3 C) - (!) 114 16 96 %     Recent laboratory studies:  Recent Labs    03/01/18 0933 03/02/18 0319 03/03/18 0357  WBC 15.4* 15.6* 13.3*  HGB 10.2* 9.3* 9.0*  HCT 33.5* 29.8* 28.9*  PLT 265 215 211  NA 138  --   --   K 3.6  --   --   CL 106  --   --   CO2 20*  --   --   BUN 16  --   --   CREATININE 1.10  --   --   GLUCOSE 161*  --   --   CALCIUM 8.3*  --   --      Discharge Medications:   Allergies as of 03/03/2018      Reactions   Cocaine Hcl Other (See Comments)   VOMITING AND TACHYCARDIA   Sudafed [pseudoephedrine Hcl] Other (See Comments)   TACHYCARDIA      Medication List    STOP taking these medications   traMADol 50 MG tablet Commonly known as:  ULTRAM     TAKE  these medications   acetaminophen 650 MG CR tablet Commonly known as:  TYLENOL Take 650 mg by mouth every 8 (eight) hours as needed for pain.   apixaban 5 MG Tabs tablet Commonly known as:  ELIQUIS Take 1 tablet (5 mg total) by mouth 2 (two) times daily.   atorvastatin 20 MG tablet Commonly known as:  LIPITOR Take 20 mg by mouth at bedtime.   BIOFREEZE EX Apply 1 application topically at bedtime as needed (pain).   cetirizine 10 MG tablet Commonly known as:  ZYRTEC Take 10 mg by mouth daily.   diphenhydramine-acetaminophen 25-500 MG Tabs tablet Commonly known as:  TYLENOL PM Take 2 tablets by mouth at bedtime.   docusate sodium 100 MG capsule Commonly known as:  COLACE Take 100 mg by mouth at bedtime.   finasteride 5 MG tablet Commonly known as:  PROSCAR Take 5 mg by mouth daily.   furosemide 40 MG tablet Commonly  known as:  LASIX Take 1 tablet (40 mg total) by mouth daily. What changed:  when to take this   gabapentin 100 MG capsule Commonly known as:  NEURONTIN Take 100-200 mg by mouth See admin instructions. Take 100 mg by mouth in the morning and 200 mg at night   levothyroxine 50 MCG tablet Commonly known as:  SYNTHROID, LEVOTHROID Take 50 mcg by mouth daily before breakfast.   losartan 50 MG tablet Commonly known as:  COZAAR Take 1 tablet (50 mg total) by mouth daily.   metoprolol succinate 25 MG 24 hr tablet Commonly known as:  TOPROL-XL Take 25 mg by mouth daily.   multivitamin with minerals Tabs tablet Take 1 tablet by mouth daily.   nitroGLYCERIN 0.4 MG SL tablet Commonly known as:  NITROSTAT Place 0.4 mg under the tongue every 5 (five) minutes as needed for chest pain.   Omega-3 1000 MG Caps Take 1,000 mg by mouth daily.   omeprazole 40 MG capsule Commonly known as:  PRILOSEC Take 1 capsule (40 mg total) by mouth daily before breakfast.   oxyCODONE 5 MG immediate release tablet Commonly known as:  Oxy IR/ROXICODONE Take 1-2 tablets (5-10 mg total) by mouth every 4 (four) hours as needed for moderate pain (pain score 4-6).   potassium chloride SA 20 MEQ tablet Commonly known as:  K-DUR,KLOR-CON Take 1 tablet (20 mEq total) by mouth daily. What changed:  when to take this   tamsulosin 0.4 MG Caps capsule Commonly known as:  FLOMAX Take 0.4 mg by mouth daily after supper.   vitamin C 500 MG tablet Commonly known as:  ASCORBIC ACID Take 500 mg by mouth daily.   Vitamin D 50 MCG (2000 UT) Caps Take 2,000 Units by mouth daily.            Durable Medical Equipment  (From admission, onward)         Start     Ordered   02/28/18 1439  DME Walker rolling  Once    Question:  Patient needs a walker to treat with the following condition  Answer:  Status post total knee replacement, right   02/28/18 1438   02/28/18 1439  DME 3 n 1  Once     02/28/18 1438           Diagnostic Studies: Dg Knee Right Port  Result Date: 02/28/2018 CLINICAL DATA:  Status post right total knee replacement. EXAM: PORTABLE RIGHT KNEE - 1-2 VIEW COMPARISON:  None. FINDINGS: Two views of the knee demonstrate a  total knee replacement. Skin staples along the anterior aspect of the knee. Soft tissue gas compatible with recent surgery. The knee arthroplasty is located without a periprosthetic fracture. Vascular calcifications. IMPRESSION: Right knee replacement with expected postsurgical changes. Electronically Signed   By: Richarda Overlie M.D.   On: 02/28/2018 12:26    Disposition: Discharge disposition: 03-Skilled Nursing Facility         Follow-up Information    Kathryne Hitch, MD Follow up in 2 week(s).   Specialty:  Orthopedic Surgery Contact information: [address] [place] [address] [phone]            Signed: Kathryne Hitch 03/03/2018, 7:28 AM

## 2018-03-03 NOTE — Progress Notes (Addendum)
SNF-[facility] facility started the [facility] authorization process this am. Per facility, it is still pending at this time.  Patient and son notified.   Vivi Barrack, [place], MSW Clinical Social Worker  [phone] 03/03/2018  4:44 PM

## 2018-03-03 NOTE — Progress Notes (Signed)
Physical Therapy Treatment Patient Details Name: Danny Nelson MRN: [id number] DOB: 1929-09-04 Today's Date: 03/03/2018    History of Present Illness Pt s/p R TKR and with hx of CAD, a-fib, dysrythmia     PT Comments    POD # 3 am session Assisted OOB. Demonstrated and instructed pt how to use belt to self assist LE as a leg lifter Assisted with amb a limited distance due to pain. General Gait Details: 25% cues for sequence, posture and position from RW; pain limiting distance.  Unsteady gait esp with turns.  HIGH FALL RISK. Pt will need ST Rehab at SNF prior to safe D/C back home.    Follow Up Recommendations  SNF     Equipment Recommendations  None recommended by PT    Recommendations for Other Services       Precautions / Restrictions Precautions Precautions: Knee;Fall Precaution Comments: demonstared and instructed how to use a belt to assist LE Restrictions Weight Bearing Restrictions: No Other Position/Activity Restrictions: WBAT    Mobility  Bed Mobility Overal bed mobility: Needs Assistance Bed Mobility: Supine to Sit     Supine to sit: Min assist;Mod assist     General bed mobility comments: demonstared and instructed on how to use a belt to assist LE however pt still required assist for upper body and scooting completion  Transfers Overall transfer level: Needs assistance Equipment used: Rolling walker (2 wheeled) Transfers: Sit to/from Stand Sit to Stand: Min assist;Mod assist         General transfer comment: 255 VC's on proper hand placement esp with stand to sit and 50% VC's on safety with turn completion prior to sit as pt attempted to sit early/too far from recliner.   Ambulation/Gait Ambulation/Gait assistance: Min assist Gait Distance (Feet): 18 Feet Assistive device: Rolling walker (2 wheeled) Gait Pattern/deviations: Step-to pattern;Antalgic;Decreased weight shift to right Gait velocity: decr   General Gait Details: 25% cues for  sequence, posture and position from RW; pain limiting distance.  Unsteady gait esp with turns.  HIGH FALL RISK.   Stairs             Wheelchair Mobility    Modified Rankin (Stroke Patients Only)       Balance                                            Cognition Arousal/Alertness: Awake/alert Behavior During Therapy: WFL for tasks assessed/performed Overall Cognitive Status: Within Functional Limits for tasks assessed                                        Exercises   Total Knee Replacement TE's 10 reps B LE ankle pumps 10 reps towel squeezes 10 reps knee presses 10 reps heel slides  10 reps SAQ's 10 reps SLR's 10 reps ABD Followed by ICE    General Comments        Pertinent Vitals/Pain Pain Assessment: No/denies pain Pain Score: 8  Pain Location: R knee Pain Descriptors / Indicators: Aching;Sore;Operative site guarding Pain Intervention(s): Monitored during session;Repositioned;Ice applied;Premedicated before session    Home Living                      Prior Function  PT Goals (current goals can now be found in the care plan section) Progress towards PT goals: Progressing toward goals    Frequency    7X/week      PT Plan Current plan remains appropriate    Co-evaluation              AM-PAC PT "6 Clicks" Mobility   Outcome Measure  Help needed turning from your back to your side while in a flat bed without using bedrails?: A Little Help needed moving from lying on your back to sitting on the side of a flat bed without using bedrails?: A Little Help needed moving to and from a bed to a chair (including a wheelchair)?: A Little Help needed standing up from a chair using your arms (e.g., wheelchair or bedside chair)?: A Little Help needed to walk in hospital room?: A Little Help needed climbing 3-5 steps with a railing? : A Lot 6 Click Score: 17    End of Session Equipment  Utilized During Treatment: Gait belt Activity Tolerance: Patient limited by fatigue;No increased pain Patient left: in chair;with call bell/phone within reach Nurse Communication: Mobility status PT Visit Diagnosis: Difficulty in walking, not elsewhere classified (R26.2)     Time: 6578-4696 PT Time Calculation (min) (ACUTE ONLY): 24 min  Charges:  $Gait Training: 8-22 mins $Therapeutic Exercise: 8-22 mins                     {Tyge Somers  PTA Acute  Rehabilitation Services Pager      [phone] Office      [phone]'

## 2018-03-03 NOTE — Care Management Important Message (Signed)
Important Message  Patient Details  Name: Danny Nelson MRN: [id number] Date of Birth: 06-19-1929   Medicare Important Message Given:  Yes    [name] 03/03/2018, 12:57 PMImportant Message  Patient Details  Name: Danny Nelson MRN: [id number] Date of Birth: [birth year]   Medicare Important Message Given:  Yes    [name] 03/03/2018, 12:57 PM

## 2018-03-04 ENCOUNTER — Encounter (HOSPITAL_COMMUNITY): Payer: Self-pay | Admitting: Orthopaedic Surgery

## 2018-03-04 DIAGNOSIS — Z96651 Presence of right artificial knee joint: Secondary | ICD-10-CM | POA: Diagnosis not present

## 2018-03-04 DIAGNOSIS — D72828 Other elevated white blood cell count: Secondary | ICD-10-CM | POA: Diagnosis not present

## 2018-03-04 DIAGNOSIS — R2681 Unsteadiness on feet: Secondary | ICD-10-CM | POA: Diagnosis not present

## 2018-03-04 DIAGNOSIS — R2689 Other abnormalities of gait and mobility: Secondary | ICD-10-CM | POA: Diagnosis not present

## 2018-03-04 DIAGNOSIS — R41841 Cognitive communication deficit: Secondary | ICD-10-CM | POA: Diagnosis not present

## 2018-03-04 DIAGNOSIS — Z7401 Bed confinement status: Secondary | ICD-10-CM | POA: Diagnosis not present

## 2018-03-04 DIAGNOSIS — E038 Other specified hypothyroidism: Secondary | ICD-10-CM | POA: Diagnosis not present

## 2018-03-04 DIAGNOSIS — I10 Essential (primary) hypertension: Secondary | ICD-10-CM | POA: Diagnosis not present

## 2018-03-04 DIAGNOSIS — R5381 Other malaise: Secondary | ICD-10-CM | POA: Diagnosis not present

## 2018-03-04 DIAGNOSIS — R21 Rash and other nonspecific skin eruption: Secondary | ICD-10-CM | POA: Diagnosis not present

## 2018-03-04 DIAGNOSIS — Z471 Aftercare following joint replacement surgery: Secondary | ICD-10-CM | POA: Diagnosis not present

## 2018-03-04 DIAGNOSIS — J3089 Other allergic rhinitis: Secondary | ICD-10-CM | POA: Diagnosis not present

## 2018-03-04 DIAGNOSIS — R7309 Other abnormal glucose: Secondary | ICD-10-CM | POA: Diagnosis not present

## 2018-03-04 DIAGNOSIS — G478 Other sleep disorders: Secondary | ICD-10-CM | POA: Diagnosis not present

## 2018-03-04 DIAGNOSIS — M6281 Muscle weakness (generalized): Secondary | ICD-10-CM | POA: Diagnosis not present

## 2018-03-04 DIAGNOSIS — M2550 Pain in unspecified joint: Secondary | ICD-10-CM | POA: Diagnosis not present

## 2018-03-04 DIAGNOSIS — L0390 Cellulitis, unspecified: Secondary | ICD-10-CM | POA: Diagnosis not present

## 2018-03-04 DIAGNOSIS — I480 Paroxysmal atrial fibrillation: Secondary | ICD-10-CM | POA: Diagnosis not present

## 2018-03-04 DIAGNOSIS — R278 Other lack of coordination: Secondary | ICD-10-CM | POA: Diagnosis not present

## 2018-03-04 NOTE — Progress Notes (Signed)
Report called to Diane at [facility].  

## 2018-03-04 NOTE — Progress Notes (Signed)
Patient ID: Danny Nelson, male   DOB: [birth year], 85 y.o.   MRN: [id number] No acute changes.  Awaiting insurance authorization for skilled nursing.  Vitals stable.  Right knee stable.  Should be discharged today.  He does not need an updated Discharge Summary since nothing changed from yesterday.

## 2018-03-04 NOTE — Plan of Care (Signed)
Plan of care reviewed and discussed with the patient. 

## 2018-03-04 NOTE — Progress Notes (Signed)
Physical Therapy Treatment Patient Details Name: Danny Nelson MRN: [id number] DOB: 1930-02-10 Today's Date: 03/04/2018    History of Present Illness Pt s/p R TKR and with hx of CAD, a-fib, dysrythmia     PT Comments    POD # 4 Pt progressing slowly due to pain and fatigue. General transfer comment: 25% VC's on safety with turns and hand placement esp with stand to sit to control decend.   General Gait Details: 25% cues for sequence, posture and position from RW; pain limiting distance.  Unsteady gait esp with turns.  HIGH FALL RISK.   Pt will need ST Rehab at SNF prior to safe D/C to home. Follow Up Recommendations  SNF     Equipment Recommendations  None recommended by PT    Recommendations for Other Services       Precautions / Restrictions Precautions Precautions: Knee;Fall Precaution Comments: demonstared and instructed how to use a belt to assist LE Restrictions Weight Bearing Restrictions: No Other Position/Activity Restrictions: WBAT    Mobility  Bed Mobility               General bed mobility comments: OOB via nursing  Transfers Overall transfer level: Needs assistance Equipment used: Rolling walker (2 wheeled) Transfers: Sit to/from [employer] Sit to Stand: Min assist;Mod assist Stand pivot transfers: Mod assist       General transfer comment: 25% VC's on safety with turns and hand placement esp with stand to sit to control decend.    Ambulation/Gait Ambulation/Gait assistance: Min assist Gait Distance (Feet): 22 Feet Assistive device: Rolling walker (2 wheeled) Gait Pattern/deviations: Step-to pattern;Antalgic;Decreased weight shift to right Gait velocity: decr   General Gait Details: 25% cues for sequence, posture and position from RW; pain limiting distance.  Unsteady gait esp with turns.  HIGH FALL RISK.   Stairs             Wheelchair Mobility    Modified Rankin (Stroke Patients Only)       Balance                                             Cognition Arousal/Alertness: Awake/alert Behavior During Therapy: WFL for tasks assessed/performed Overall Cognitive Status: Within Functional Limits for tasks assessed                                        Exercises      General Comments        Pertinent Vitals/Pain Pain Assessment: 0-10 Pain Score: 8  Pain Location: R knee Pain Descriptors / Indicators: Aching;Sore;Operative site guarding Pain Intervention(s): Monitored during session;Repositioned;Ice applied    Home Living                      Prior Function            PT Goals (current goals can now be found in the care plan section) Progress towards PT goals: Progressing toward goals    Frequency    7X/week      PT Plan Current plan remains appropriate    Co-evaluation              AM-PAC PT "6 Clicks" Mobility   Outcome Measure  Help needed turning from your back to  your side while in a flat bed without using bedrails?: A Little Help needed moving from lying on your back to sitting on the side of a flat bed without using bedrails?: A Little Help needed moving to and from a bed to a chair (including a wheelchair)?: A Little Help needed standing up from a chair using your arms (e.g., wheelchair or bedside chair)?: A Little Help needed to walk in hospital room?: A Little Help needed climbing 3-5 steps with a railing? : A Lot 6 Click Score: 17    End of Session Equipment Utilized During Treatment: Gait belt Activity Tolerance: Patient limited by fatigue;Patient limited by pain Patient left: in chair;with call bell/phone within reach Nurse Communication: Mobility status PT Visit Diagnosis: Difficulty in walking, not elsewhere classified (R26.2)     Time: 1005-1030 PT Time Calculation (min) (ACUTE ONLY): 25 min  Charges:  $Gait Training: 8-22 mins $Therapeutic Activity: 8-22 mins                     Felecia Shelling  PTA Acute  Rehabilitation Services Pager      [phone] Office      [phone]

## 2018-03-04 NOTE — Clinical Social Work Placement (Signed)
D/C Summary sent.  Nurse call report:   CLINICAL SOCIAL WORK PLACEMENT  NOTE  Date:  03/04/2018  Patient Details  Name: Danny Nelson MRN: [id number] Date of Birth: 12/24/1929  Clinical Social Work is seeking post-discharge placement for this patient at the Skilled  Nursing Facility level of care (*CSW will initial, date and re-position this form in  chart as items are completed):  Yes   Patient/family provided with [facility] Clinical Social Work Department's list of facilities offering this level of care within the geographic area requested by the patient (or if unable, by the patient's family).  Yes   Patient/family informed of their freedom to choose among providers that offer the needed level of care, that participate in Medicare, Medicaid or managed care program needed by the patient, have an available bed and are willing to accept the patient.  Yes   Patient/family informed of [facility]'s ownership interest in [facility] and [facility], as well as of the fact that they are under no obligation to receive care at these facilities.  PASRR submitted to EDS on       PASRR number received on       Existing PASRR number confirmed on       FL2 transmitted to all facilities in geographic area requested by pt/family on       FL2 transmitted to all facilities within larger geographic area on 03/04/18     Patient informed that his/her managed care company has contracts with or will negotiate with certain facilities, including the following:  [employer] informed of bed offers received.  Patient chooses bed at [facility]     Physician recommends and patient chooses bed at      Patient to be transferred to [facility] on 03/04/18.  Patient to be transferred to facility by [facility]     Patient family notified on 03/04/18 of transfer.  Name of family member notified:  Son      PHYSICIAN       Additional Comment:     _______________________________________________ Clearance Coots, LCSW 03/04/2018, 2:37 PM

## 2018-03-04 NOTE — Progress Notes (Signed)
Occupational Therapy Treatment Patient Details Name: Danny Nelson MRN: [id number] DOB: 03/15/1929 Today's Date: 03/04/2018    History of present illness Pt s/p R TKR and with hx of CAD, a-fib, dysrythmia    OT comments  Pt presents seated in recliner pleasant and willing to participate in therapy session. Focus of session LB ADL. Pt requiring minA for LB bathing with assist to access RLE due to LE pain and stiffness. Began education regarding use of AE for LB dressing ADL with pt return demonstrating understanding with mod cues to perform correctly. Pt hopeful for discharge to next venue soon. Will continue to follow while he remains in acute setting to progress pt towards established OT goals.    Follow Up Recommendations  SNF    Equipment Recommendations  3 in 1 bedside commode          Precautions / Restrictions Precautions Precautions: Knee;Fall Precaution Comments: demonstared and instructed how to use a belt to assist LE Restrictions Weight Bearing Restrictions: No Other Position/Activity Restrictions: WBAT       Mobility Bed Mobility               General bed mobility comments: OOB in recliner upon arrival  Transfers Overall transfer level: Needs assistance Equipment used: Rolling walker (2 wheeled) Transfers: Sit to/from [employer] Sit to Stand: Min assist;Mod assist Stand pivot transfers: Mod assist       General transfer comment: 25% VC's on safety with turns and hand placement esp with stand to sit to control decend.      Balance                                           ADL either performed or assessed with clinical judgement   ADL Overall ADL's : Needs assistance/impaired             Lower Body Bathing: Minimal assistance;Sitting/lateral leans Lower Body Bathing Details (indicate cue type and reason): assist to wash lower portion of RLE, pt able to wash LLE     Lower Body Dressing: Moderate  assistance;Sit to/from stand Lower Body Dressing Details (indicate cue type and reason): educated on use of sock aide and reacher for performing LB ADL, pt return demonstrating use of reacher with mod cues; attempted trial of sock aide however pt will require use of wide sock aide due to width of foot to fully perform task; assist for TEDs; pt able to don L sock without assist     Toileting- Clothing Manipulation and Hygiene: Set up;Sitting/lateral lean;Min guard Toileting - Clothing Manipulation Details (indicate cue type and reason): pt using urinal while seated in recliner             Vision       Perception     Praxis      Cognition Arousal/Alertness: Awake/alert Behavior During Therapy: WFL for tasks assessed/performed Overall Cognitive Status: Within Functional Limits for tasks assessed                                          Exercises     Shoulder Instructions       General Comments pt's daughter present during session    Pertinent Vitals/ Pain       Pain Assessment: Faces Pain Score:  8  Faces Pain Scale: Hurts even more Pain Location: R knee Pain Descriptors / Indicators: Aching;Sore;Operative site guarding Pain Intervention(s): Limited activity within patient's tolerance;Monitored during session;Repositioned;Ice applied  Home Living                                          Prior Functioning/Environment              Frequency  Min 2X/week        Progress Toward Goals  OT Goals(current goals can now be found in the care plan section)  Progress towards OT goals: Progressing toward goals  Acute Rehab OT Goals Patient Stated Goal: hoping for d/c to rehab soon OT Goal Formulation: With patient Time For Goal Achievement: 03/15/18 Potential to Achieve Goals: Good  Plan Discharge plan remains appropriate    Co-evaluation                 AM-PAC OT "6 Clicks" Daily Activity     Outcome Measure   Help  from another person eating meals?: None Help from another person taking care of personal grooming?: None Help from another person toileting, which includes using toliet, bedpan, or urinal?: A Little Help from another person bathing (including washing, rinsing, drying)?: A Lot Help from another person to put on and taking off regular upper body clothing?: None Help from another person to put on and taking off regular lower body clothing?: A Lot 6 Click Score: 19    End of Session Equipment Utilized During Treatment: Other (comment)(adaptive equipment)  OT Visit Diagnosis: Pain Pain - Right/Left: Right Pain - part of body: Knee   Activity Tolerance Patient tolerated treatment well   Patient Left in chair;with call bell/phone within reach;with family/visitor present   Nurse Communication Mobility status        Time: 5456-2563 OT Time Calculation (min): 31 min  Charges: OT General Charges $OT Visit: 1 Visit OT Treatments $Self Care/Home Management : 23-37 mins  Marcy Siren, OT Supplemental Rehabilitation Services Pager [phone] Office [phone]    Orlando Penner 03/04/2018, 1:10 PM

## 2018-03-05 DIAGNOSIS — E038 Other specified hypothyroidism: Secondary | ICD-10-CM | POA: Diagnosis not present

## 2018-03-05 DIAGNOSIS — G478 Other sleep disorders: Secondary | ICD-10-CM | POA: Diagnosis not present

## 2018-03-05 DIAGNOSIS — Z96651 Presence of right artificial knee joint: Secondary | ICD-10-CM | POA: Diagnosis not present

## 2018-03-05 DIAGNOSIS — I480 Paroxysmal atrial fibrillation: Secondary | ICD-10-CM | POA: Diagnosis not present

## 2018-03-06 DIAGNOSIS — Z96651 Presence of right artificial knee joint: Secondary | ICD-10-CM | POA: Diagnosis not present

## 2018-03-06 DIAGNOSIS — D72828 Other elevated white blood cell count: Secondary | ICD-10-CM | POA: Diagnosis not present

## 2018-03-06 DIAGNOSIS — G478 Other sleep disorders: Secondary | ICD-10-CM | POA: Diagnosis not present

## 2018-03-06 DIAGNOSIS — J3089 Other allergic rhinitis: Secondary | ICD-10-CM | POA: Diagnosis not present

## 2018-03-07 ENCOUNTER — Telehealth (INDEPENDENT_AMBULATORY_CARE_PROVIDER_SITE_OTHER): Payer: Self-pay

## 2018-03-07 DIAGNOSIS — L0390 Cellulitis, unspecified: Secondary | ICD-10-CM | POA: Diagnosis not present

## 2018-03-07 DIAGNOSIS — R21 Rash and other nonspecific skin eruption: Secondary | ICD-10-CM | POA: Diagnosis not present

## 2018-03-07 NOTE — Telephone Encounter (Signed)
Dianne at [facility] called stating that patient's right knee is red and hot and that Dr. Harrison Mons evaluated patient and put patient on antibiotic (Keflex 250mg ).  Dr.Blake wanted to know if you would like to continue this treatment for 14 days or if patient needed to be seen?  CB# is [phone].  Fax# [phone].  Please advise.  Thank you.

## 2018-03-07 NOTE — Telephone Encounter (Signed)
Definitely continue the antibiotic and I would like to see the patient in our office with me or Bronson Curb this coming Monday.

## 2018-03-07 NOTE — Telephone Encounter (Signed)
Noted.  Talked with Alona Bene at [facility] and advised her of message below per Dr. Magnus Ivan.  Appt. Scheduled for Monday,03/10/2018

## 2018-03-07 NOTE — Telephone Encounter (Signed)
Patient has appointment scheduled for Monday, 03/10/2018 per Dr. Magnus Ivan.  See previous message in patient's chart.

## 2018-03-10 ENCOUNTER — Encounter (INDEPENDENT_AMBULATORY_CARE_PROVIDER_SITE_OTHER): Payer: Self-pay | Admitting: Orthopaedic Surgery

## 2018-03-10 ENCOUNTER — Ambulatory Visit (INDEPENDENT_AMBULATORY_CARE_PROVIDER_SITE_OTHER): Payer: Medicare HMO | Admitting: Orthopaedic Surgery

## 2018-03-10 DIAGNOSIS — Z96651 Presence of right artificial knee joint: Secondary | ICD-10-CM

## 2018-03-10 NOTE — Progress Notes (Signed)
The patient is here 10 days status post a open right total knee arthroplasty.  He is staying in a skilled nursing facility.  They were concerned about redness in the knee and put him on Keflex we want to see him today.  He is someone who is on the blood thinner Eliquis.  On exam the knee is not red.  It is actually bruised and swollen from the blood thinning medication that he is on and this is the swelling that we see from time to time postoperative.  He does not need to be on antibiotics.  However it is too early to remove the staples.  I gave him reassurance that everything looks good.  His calf is soft.  I am fine if the facility wants to put him in a CPM to get knee moving.  We will see him back in a week for staple removal.  All question concerns were answered and addressed.

## 2018-03-12 DIAGNOSIS — I10 Essential (primary) hypertension: Secondary | ICD-10-CM | POA: Diagnosis not present

## 2018-03-12 DIAGNOSIS — Z96651 Presence of right artificial knee joint: Secondary | ICD-10-CM | POA: Diagnosis not present

## 2018-03-12 DIAGNOSIS — R21 Rash and other nonspecific skin eruption: Secondary | ICD-10-CM | POA: Diagnosis not present

## 2018-03-12 DIAGNOSIS — L0390 Cellulitis, unspecified: Secondary | ICD-10-CM | POA: Diagnosis not present

## 2018-03-17 DIAGNOSIS — R21 Rash and other nonspecific skin eruption: Secondary | ICD-10-CM | POA: Diagnosis not present

## 2018-03-17 DIAGNOSIS — L0390 Cellulitis, unspecified: Secondary | ICD-10-CM | POA: Diagnosis not present

## 2018-03-17 DIAGNOSIS — R7309 Other abnormal glucose: Secondary | ICD-10-CM | POA: Diagnosis not present

## 2018-03-17 DIAGNOSIS — Z96651 Presence of right artificial knee joint: Secondary | ICD-10-CM | POA: Diagnosis not present

## 2018-03-18 ENCOUNTER — Encounter (INDEPENDENT_AMBULATORY_CARE_PROVIDER_SITE_OTHER): Payer: Self-pay | Admitting: Orthopaedic Surgery

## 2018-03-18 ENCOUNTER — Ambulatory Visit (INDEPENDENT_AMBULATORY_CARE_PROVIDER_SITE_OTHER): Payer: Medicare HMO | Admitting: Orthopaedic Surgery

## 2018-03-18 DIAGNOSIS — Z96651 Presence of right artificial knee joint: Secondary | ICD-10-CM

## 2018-03-18 NOTE — Progress Notes (Signed)
The patient is now 18 days status post a right total knee arthroplasty.  He is staying in a nursing care facility working on his mobility.  He is [age] years old.  He states that he is doing well.  He is scheduled to be discharged on Monday from the skilled nursing facility.  On examination his right calf is soft.  He lacks full extension by about 3 to 5 degrees and we can flex him to 90 degrees.  Remove the staples in place Steri-Strips.  He does have moderate swelling in the knee itself but there is no redness and no evidence of infection.  Transition to home therapy as soon as possible or even outpatient therapy after he goes home.  He can drive in 2 weeks from now.  The Steri-Strips will fall off on their own.  All question concerns were answered and addressed.  Follow-up will be in 4 weeks but no x-rays are needed.

## 2018-03-21 DIAGNOSIS — I10 Essential (primary) hypertension: Secondary | ICD-10-CM | POA: Diagnosis not present

## 2018-03-21 DIAGNOSIS — E038 Other specified hypothyroidism: Secondary | ICD-10-CM | POA: Diagnosis not present

## 2018-03-21 DIAGNOSIS — Z96651 Presence of right artificial knee joint: Secondary | ICD-10-CM | POA: Diagnosis not present

## 2018-03-21 DIAGNOSIS — I480 Paroxysmal atrial fibrillation: Secondary | ICD-10-CM | POA: Diagnosis not present

## 2018-03-22 DIAGNOSIS — R2689 Other abnormalities of gait and mobility: Secondary | ICD-10-CM | POA: Diagnosis not present

## 2018-03-22 DIAGNOSIS — Z471 Aftercare following joint replacement surgery: Secondary | ICD-10-CM | POA: Diagnosis not present

## 2018-03-22 DIAGNOSIS — R278 Other lack of coordination: Secondary | ICD-10-CM | POA: Diagnosis not present

## 2018-03-22 DIAGNOSIS — M6281 Muscle weakness (generalized): Secondary | ICD-10-CM | POA: Diagnosis not present

## 2018-03-22 DIAGNOSIS — R2681 Unsteadiness on feet: Secondary | ICD-10-CM | POA: Diagnosis not present

## 2018-03-22 DIAGNOSIS — R41841 Cognitive communication deficit: Secondary | ICD-10-CM | POA: Diagnosis not present

## 2018-03-24 ENCOUNTER — Telehealth (INDEPENDENT_AMBULATORY_CARE_PROVIDER_SITE_OTHER): Payer: Self-pay | Admitting: Orthopaedic Surgery

## 2018-03-24 NOTE — Telephone Encounter (Signed)
Kenney Houseman from [facility] left a message wanting to verify that Dr. Magnus Ivan is willing to sign the orders for the patient's PT and OT.  CB#[phone].  Thank you.

## 2018-03-25 DIAGNOSIS — R2681 Unsteadiness on feet: Secondary | ICD-10-CM | POA: Diagnosis not present

## 2018-03-25 DIAGNOSIS — M6281 Muscle weakness (generalized): Secondary | ICD-10-CM | POA: Diagnosis not present

## 2018-03-25 DIAGNOSIS — Z471 Aftercare following joint replacement surgery: Secondary | ICD-10-CM | POA: Diagnosis not present

## 2018-03-25 DIAGNOSIS — M1711 Unilateral primary osteoarthritis, right knee: Secondary | ICD-10-CM | POA: Diagnosis not present

## 2018-03-25 NOTE — Telephone Encounter (Signed)
Verbal order given that Magnus IvanBlackman would sign for this

## 2018-03-26 DIAGNOSIS — Z471 Aftercare following joint replacement surgery: Secondary | ICD-10-CM | POA: Diagnosis not present

## 2018-03-26 DIAGNOSIS — Z96651 Presence of right artificial knee joint: Secondary | ICD-10-CM | POA: Diagnosis not present

## 2018-03-26 DIAGNOSIS — R600 Localized edema: Secondary | ICD-10-CM | POA: Diagnosis not present

## 2018-03-26 DIAGNOSIS — I2510 Atherosclerotic heart disease of native coronary artery without angina pectoris: Secondary | ICD-10-CM | POA: Diagnosis not present

## 2018-03-26 DIAGNOSIS — R1310 Dysphagia, unspecified: Secondary | ICD-10-CM | POA: Diagnosis not present

## 2018-03-26 DIAGNOSIS — N400 Enlarged prostate without lower urinary tract symptoms: Secondary | ICD-10-CM | POA: Diagnosis not present

## 2018-03-26 DIAGNOSIS — E039 Hypothyroidism, unspecified: Secondary | ICD-10-CM | POA: Diagnosis not present

## 2018-03-26 DIAGNOSIS — I10 Essential (primary) hypertension: Secondary | ICD-10-CM | POA: Diagnosis not present

## 2018-03-26 DIAGNOSIS — I4891 Unspecified atrial fibrillation: Secondary | ICD-10-CM | POA: Diagnosis not present

## 2018-03-27 DIAGNOSIS — I2510 Atherosclerotic heart disease of native coronary artery without angina pectoris: Secondary | ICD-10-CM | POA: Diagnosis not present

## 2018-03-27 DIAGNOSIS — Z96651 Presence of right artificial knee joint: Secondary | ICD-10-CM | POA: Diagnosis not present

## 2018-03-27 DIAGNOSIS — I4891 Unspecified atrial fibrillation: Secondary | ICD-10-CM | POA: Diagnosis not present

## 2018-03-27 DIAGNOSIS — I10 Essential (primary) hypertension: Secondary | ICD-10-CM | POA: Diagnosis not present

## 2018-03-27 DIAGNOSIS — E039 Hypothyroidism, unspecified: Secondary | ICD-10-CM | POA: Diagnosis not present

## 2018-03-27 DIAGNOSIS — R1310 Dysphagia, unspecified: Secondary | ICD-10-CM | POA: Diagnosis not present

## 2018-03-27 DIAGNOSIS — R600 Localized edema: Secondary | ICD-10-CM | POA: Diagnosis not present

## 2018-03-27 DIAGNOSIS — N400 Enlarged prostate without lower urinary tract symptoms: Secondary | ICD-10-CM | POA: Diagnosis not present

## 2018-03-27 DIAGNOSIS — Z471 Aftercare following joint replacement surgery: Secondary | ICD-10-CM | POA: Diagnosis not present

## 2018-03-29 DIAGNOSIS — E039 Hypothyroidism, unspecified: Secondary | ICD-10-CM | POA: Diagnosis not present

## 2018-03-29 DIAGNOSIS — Z96651 Presence of right artificial knee joint: Secondary | ICD-10-CM | POA: Diagnosis not present

## 2018-03-29 DIAGNOSIS — I10 Essential (primary) hypertension: Secondary | ICD-10-CM | POA: Diagnosis not present

## 2018-03-29 DIAGNOSIS — I4891 Unspecified atrial fibrillation: Secondary | ICD-10-CM | POA: Diagnosis not present

## 2018-03-29 DIAGNOSIS — Z471 Aftercare following joint replacement surgery: Secondary | ICD-10-CM | POA: Diagnosis not present

## 2018-03-29 DIAGNOSIS — N400 Enlarged prostate without lower urinary tract symptoms: Secondary | ICD-10-CM | POA: Diagnosis not present

## 2018-03-29 DIAGNOSIS — I2510 Atherosclerotic heart disease of native coronary artery without angina pectoris: Secondary | ICD-10-CM | POA: Diagnosis not present

## 2018-03-29 DIAGNOSIS — R1310 Dysphagia, unspecified: Secondary | ICD-10-CM | POA: Diagnosis not present

## 2018-03-29 DIAGNOSIS — R600 Localized edema: Secondary | ICD-10-CM | POA: Diagnosis not present

## 2018-04-01 DIAGNOSIS — R600 Localized edema: Secondary | ICD-10-CM | POA: Diagnosis not present

## 2018-04-01 DIAGNOSIS — E039 Hypothyroidism, unspecified: Secondary | ICD-10-CM | POA: Diagnosis not present

## 2018-04-01 DIAGNOSIS — I10 Essential (primary) hypertension: Secondary | ICD-10-CM | POA: Diagnosis not present

## 2018-04-01 DIAGNOSIS — N400 Enlarged prostate without lower urinary tract symptoms: Secondary | ICD-10-CM | POA: Diagnosis not present

## 2018-04-01 DIAGNOSIS — I4891 Unspecified atrial fibrillation: Secondary | ICD-10-CM | POA: Diagnosis not present

## 2018-04-01 DIAGNOSIS — R1310 Dysphagia, unspecified: Secondary | ICD-10-CM | POA: Diagnosis not present

## 2018-04-01 DIAGNOSIS — Z96651 Presence of right artificial knee joint: Secondary | ICD-10-CM | POA: Diagnosis not present

## 2018-04-01 DIAGNOSIS — I2510 Atherosclerotic heart disease of native coronary artery without angina pectoris: Secondary | ICD-10-CM | POA: Diagnosis not present

## 2018-04-01 DIAGNOSIS — Z471 Aftercare following joint replacement surgery: Secondary | ICD-10-CM | POA: Diagnosis not present

## 2018-04-04 DIAGNOSIS — R600 Localized edema: Secondary | ICD-10-CM | POA: Diagnosis not present

## 2018-04-04 DIAGNOSIS — Z471 Aftercare following joint replacement surgery: Secondary | ICD-10-CM | POA: Diagnosis not present

## 2018-04-04 DIAGNOSIS — I4891 Unspecified atrial fibrillation: Secondary | ICD-10-CM | POA: Diagnosis not present

## 2018-04-04 DIAGNOSIS — I2510 Atherosclerotic heart disease of native coronary artery without angina pectoris: Secondary | ICD-10-CM | POA: Diagnosis not present

## 2018-04-04 DIAGNOSIS — N400 Enlarged prostate without lower urinary tract symptoms: Secondary | ICD-10-CM | POA: Diagnosis not present

## 2018-04-04 DIAGNOSIS — R1310 Dysphagia, unspecified: Secondary | ICD-10-CM | POA: Diagnosis not present

## 2018-04-04 DIAGNOSIS — Z96651 Presence of right artificial knee joint: Secondary | ICD-10-CM | POA: Diagnosis not present

## 2018-04-04 DIAGNOSIS — E039 Hypothyroidism, unspecified: Secondary | ICD-10-CM | POA: Diagnosis not present

## 2018-04-04 DIAGNOSIS — I10 Essential (primary) hypertension: Secondary | ICD-10-CM | POA: Diagnosis not present

## 2018-04-07 ENCOUNTER — Telehealth (INDEPENDENT_AMBULATORY_CARE_PROVIDER_SITE_OTHER): Payer: Self-pay | Admitting: Orthopaedic Surgery

## 2018-04-07 NOTE — Telephone Encounter (Signed)
See below

## 2018-04-07 NOTE — Telephone Encounter (Signed)
Received voicemail from [employer] -(PT) with [facility] called left voicemail message stating patient was discharged from [facility]. Patient request 1 missed visit last week due to patient moving to a different apartment. Patient will not see Dr Magnus Ivan until next week. She don't know if Dr. Magnus Ivan want patient to start outpatient therapy. Cherise asked that the patient be contacted at [phone]

## 2018-04-08 ENCOUNTER — Other Ambulatory Visit (INDEPENDENT_AMBULATORY_CARE_PROVIDER_SITE_OTHER): Payer: Self-pay

## 2018-04-08 ENCOUNTER — Telehealth (INDEPENDENT_AMBULATORY_CARE_PROVIDER_SITE_OTHER): Payer: Self-pay | Admitting: Orthopedic Surgery

## 2018-04-08 ENCOUNTER — Telehealth (INDEPENDENT_AMBULATORY_CARE_PROVIDER_SITE_OTHER): Payer: Self-pay | Admitting: Orthopaedic Surgery

## 2018-04-08 DIAGNOSIS — Z96651 Presence of right artificial knee joint: Secondary | ICD-10-CM

## 2018-04-08 NOTE — Telephone Encounter (Signed)
I would go ahead and have him set up for outpatient physical therapy so they can work on range of motion and strengthening of his total knee replacement.

## 2018-04-08 NOTE — Telephone Encounter (Signed)
Patient states he wants to talk to [facility] at his appt first before starting out patient therapy

## 2018-04-08 NOTE — Telephone Encounter (Signed)
Message sent in error

## 2018-04-08 NOTE — Telephone Encounter (Signed)
Charisse with [facility] called stating that the patient missed an appointment last week and did not want to make it up.  She is wondering if Dr. Magnus Ivan would like him to have Outpatient PT.  Thank you.

## 2018-04-16 ENCOUNTER — Encounter (INDEPENDENT_AMBULATORY_CARE_PROVIDER_SITE_OTHER): Payer: Self-pay | Admitting: Orthopaedic Surgery

## 2018-04-16 ENCOUNTER — Ambulatory Visit (INDEPENDENT_AMBULATORY_CARE_PROVIDER_SITE_OTHER): Payer: Medicare HMO | Admitting: Orthopaedic Surgery

## 2018-04-16 DIAGNOSIS — Z96651 Presence of right artificial knee joint: Secondary | ICD-10-CM

## 2018-04-16 NOTE — Progress Notes (Signed)
The patient is an 85 year old who is now 6 weeks to 7 weeks status post a right total knee arthroplasty.  He says he is walking without assistive device and doing well overall.  He is driving.  He has no pain when he walks.  It does still wake him up at night and he is taking tramadol on occasion.  He also is taking Liquigel Aleve tablets.  On exam his knee is still swollen and asked to be expected on the right side.  Feels loosely stable.  He lacks full extension by about 2 degrees knee flexes to about 110 degrees.  At this point continue increase his activities as comfort allows.  If he runs low on tramadol he can always call us for some more.  I would like to see him back in 3 months to see how is doing overall.  I would like an AP and lateral the right knee at that visit.  All questions concerns were answered and addressed.

## 2018-04-24 ENCOUNTER — Ambulatory Visit: Payer: Medicare HMO | Admitting: Cardiology

## 2018-04-24 ENCOUNTER — Encounter: Payer: Self-pay | Admitting: Cardiology

## 2018-04-24 ENCOUNTER — Telehealth: Payer: Self-pay | Admitting: *Deleted

## 2018-04-24 VITALS — BP 110/64 | HR 73 | Ht 69.0 in | Wt 199.4 lb

## 2018-04-24 DIAGNOSIS — I4821 Permanent atrial fibrillation: Secondary | ICD-10-CM

## 2018-04-24 DIAGNOSIS — Z9861 Coronary angioplasty status: Secondary | ICD-10-CM

## 2018-04-24 DIAGNOSIS — I2510 Atherosclerotic heart disease of native coronary artery without angina pectoris: Secondary | ICD-10-CM

## 2018-04-24 DIAGNOSIS — R600 Localized edema: Secondary | ICD-10-CM | POA: Diagnosis not present

## 2018-04-24 NOTE — Telephone Encounter (Signed)
Pt has been taking Eliquis for some time now.  His insurance company, per his report, has changed Eliquis from a tier 2 to a tier 3 and his cost has increased dramatically.  I have looked at his health insurance ([employer] premium plus ppo)formulary online and Eliquis is listed at a preferred medication.  I can find nowhere in the formulary where tier levels are listed nor if a tier exception can be completed.  I will forward this information to our specialist to see if there is anything that can be done to help the patient with decreasing the cost of this medication.

## 2018-04-24 NOTE — Progress Notes (Signed)
Cardiology Office Note:    Date:  04/24/2018   ID:  Danny Nelson, DOB 1929/11/11, MRN [id number]  PCP:  Marden Noble, MD  Cardiologist:  Donato Schultz, MD  Electrophysiologist:  None   Referring MD: Marden Noble, MD     History of Present Illness:    Danny Nelson is a 85 y.o. male here for preoperative cardiac risk evaluation prior to left knee surgery.  His stress test on 10/28/2017 was low risk with no ischemia EF 52% Echo:- The patient was in atrial fibrillation. Normal LV size with mild   focal basal septal hypertrophy. EF 55%. Normal RV size and   systolic function. Aortic valve sclerosis without significant   stenosis. Mild aortic insufficiency. Mild pulmonary hypertension.  He saw [place] [facility] in evaluation on 10/22/2017.  Review of her note, his establish cardiac history has been treated at outside hospitals, has history of CAD and atrial fibrillation.  Had stent in [place] in 2009, was treated for atrial fibrillation in [place] in 2011, he is on chronic anticoagulation with Coumadin followed by Dr. Kevan Ny.  He is also on low-dose amiodarone.  He had cardioversion as well as attempted atrial fibrillation ablation but these measures failed.  He had been on amiodarone therapy, 100 mg a day since then.  No recent chest pain, no recent dyspnea not very active.  No significant palpitations has bilateral lower extremity edema 2+.  Murmur noted.  Currently doing quite well.  He did bang his left lower leg on an air conditioning unit.  He has a fluid-filled structure surrounding that leg.  No signs of infection.  Likely accumulation focal of his edema.  Could have been partial hematoma as well with his Coumadin.  04/24/2018-here for the follow-up of coronary artery disease and atrial fibrillation.  Had a knee replacement, overall doing quite well, Dr. Magnus Ivan.  Eliquis is quite expensive.  He states that is now a tier 3 medication.  He is looking for advice on this.  Used to  be on Coumadin followed by Dr. Kevan Ny.  May need to go back if too expensive.  Denies any fevers chills nausea vomiting syncope bleeding.  Minimal shortness of breath with activity.    Past Medical History:  Diagnosis Date  . Anticoagulant long-term use   . Anticoagulation goal of INR 2 to 3   . Atrial fibrillation (HCC)   . BPH (benign prostatic hyperplasia)   . CAD (coronary artery disease)   . Complication of anesthesia    for throat surgery, was nasallyngiven cocaine and had a reaction from this -tachycardia and vomiting , then for spinal for another surgery, had problems with voiding-nneded a catheter  . Dyspnea on exertion   . Dysrhythmia    A-Fib  . Hypertension   . Hypothyroidism   . Hypovitaminosis D   . Leg edema   . Osteoarthritis     Past Surgical History:  Procedure Laterality Date  . CHOLECYSTECTOMY    . CORONARY ANGIOPLASTY     2 cardiac stents  . left foot surgery  1989   for spurs  . left shoulder surgery     torn left shoulder ligament  . right rotator cuff     right shoulder  . TOTAL KNEE ARTHROPLASTY Right 02/28/2018   Procedure: RIGHT TOTAL KNEE ARTHROPLASTY;  Surgeon: Kathryne Hitch, MD;  Location: [facility];  Service: Orthopedics;  Laterality: Right;  . vocal cord surgery  2003    Current Medications: Current Meds  Medication  Sig  . acetaminophen (TYLENOL) 650 MG CR tablet Take 650 mg by mouth every 8 (eight) hours as needed for pain.  Marland Kitchen apixaban (ELIQUIS) 5 MG TABS tablet Take 1 tablet (5 mg total) by mouth 2 (two) times daily.  Marland Kitchen atorvastatin (LIPITOR) 20 MG tablet Take 20 mg by mouth at bedtime.   . cetirizine (ZYRTEC) 10 MG tablet Take 10 mg by mouth daily.  . Cholecalciferol (VITAMIN D) 50 MCG (2000 UT) CAPS Take 2,000 Units by mouth daily.  . diphenhydramine-acetaminophen (TYLENOL PM) 25-500 MG TABS tablet Take 2 tablets by mouth at bedtime.  . docusate sodium (COLACE) 100 MG capsule Take 100 mg by mouth at bedtime.  . finasteride  (PROSCAR) 5 MG tablet Take 5 mg by mouth daily.   . furosemide (LASIX) 40 MG tablet Take 1 tablet (40 mg total) by mouth daily.  Marland Kitchen gabapentin (NEURONTIN) 100 MG capsule Take 100-200 mg by mouth See admin instructions. Take 100 mg by mouth in the morning and 200 mg at night  . levothyroxine (SYNTHROID, LEVOTHROID) 50 MCG tablet Take 50 mcg by mouth daily before breakfast.   . losartan (COZAAR) 50 MG tablet Take 1 tablet (50 mg total) by mouth daily.  . Menthol, Topical Analgesic, (BIOFREEZE EX) Apply 1 application topically at bedtime as needed (pain).  . metoprolol succinate (TOPROL-XL) 25 MG 24 hr tablet Take 25 mg by mouth daily.   . Multiple Vitamin (MULTIVITAMIN WITH MINERALS) TABS tablet Take 1 tablet by mouth daily.  . nitroGLYCERIN (NITROSTAT) 0.4 MG SL tablet Place 0.4 mg under the tongue every 5 (five) minutes as needed for chest pain.  . Omega-3 1000 MG CAPS Take 1,000 mg by mouth daily.  Marland Kitchen omeprazole (PRILOSEC) 40 MG capsule Take 1 capsule (40 mg total) by mouth daily before breakfast.  . oxyCODONE (OXY IR/ROXICODONE) 5 MG immediate release tablet Take 1-2 tablets (5-10 mg total) by mouth every 4 (four) hours as needed for moderate pain (pain score 4-6).  Marland Kitchen potassium chloride SA (K-DUR,KLOR-CON) 20 MEQ tablet Take 1 tablet (20 mEq total) by mouth daily.  . tamsulosin (FLOMAX) 0.4 MG CAPS capsule Take 0.4 mg by mouth daily after supper.  . traMADol (ULTRAM) 50 MG tablet Take 50 mg by mouth 3 (three) times daily as needed.  . vitamin C (ASCORBIC ACID) 500 MG tablet Take 500 mg by mouth daily.     Allergies:   Cocaine hcl and Sudafed [pseudoephedrine hcl]   Social History   Socioeconomic History  . Marital status: Married    Spouse name: Not on file  . Number of children: 5  . Years of education: Not on file  . Highest education level: Not on file  Occupational History  . Not on file  Social Needs  . Financial resource strain: Not on file  . Food insecurity:    Worry: Not on  file    Inability: Not on file  . Transportation needs:    Medical: Not on file    Non-medical: Not on file  Tobacco Use  . Smoking status: Never Smoker  . Smokeless tobacco: Never Used  Substance and Sexual Activity  . Alcohol use: Never    Frequency: Never  . Drug use: Never  . Sexual activity: Not on file  Lifestyle  . Physical activity:    Days per week: Not on file    Minutes per session: Not on file  . Stress: Not on file  Relationships  . Social connections:  Talks on phone: Not on file    Gets together: Not on file    Attends religious service: Not on file    Active member of club or organization: Not on file    Attends meetings of clubs or organizations: Not on file    Relationship status: Not on file  Other Topics Concern  . Not on file  Social History Narrative  . Not on file     Family History: The patient's his brother died of heart failure at age 68.  ROS:   Please see the history of present illness.     All other systems reviewed and are negative.  EKGs/Labs/Other Studies Reviewed:    The following studies were reviewed today: Prior office notes, EKG, nuclear stress test, echocardiogram personally reviewed  EKG: Atrial fibrillation heart rates in the 70s personally reviewed.  Recent Labs: 03/01/2018: BUN 16; Creatinine, Ser 1.10; Potassium 3.6; Sodium 138 03/03/2018: Hemoglobin 9.0; Platelets 211  Recent Lipid Panel No results found for: CHOL, TRIG, HDL, CHOLHDL, VLDL, LDLCALC, LDLDIRECT  Physical Exam:    VS:  BP 110/64   Pulse 73   Ht 5\' 9"  (1.753 m)   Wt 199 lb 6.4 oz (90.4 kg)   SpO2 98%   BMI 29.45 kg/m     Wt Readings from Last 3 Encounters:  04/24/18 199 lb 6.4 oz (90.4 kg)  02/28/18 200 lb 9.9 oz (91 kg)  02/18/18 200 lb 9.6 oz (91 kg)     GEN: Well nourished, well developed, in no acute distress  HEENT: normal  Neck: no JVD, carotid bruits, or masses Cardiac: irreg normal rate; mild SM, no rubs, or gallops, right lower  extremity edema  Respiratory:  clear to auscultation bilaterally, normal work of breathing GI: soft, nontender, nondistended, + BS MS: no deformity or atrophy  Skin: warm and dry, no rash Neuro:  Alert and Oriented x 3, Strength and sensation are intact Psych: euthymic mood, full affect   ASSESSMENT:    1. Permanent atrial fibrillation   2. Coronary artery disease involving native coronary artery of native heart without angina pectoris   3. CAD S/P percutaneous coronary angioplasty   4. 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  Not having any anginal symptoms.  Feeling quite well.  Continue with secondary risk factor prevention.  Permanent atrial fibrillation - Both atrial fibrillation ablation and cardioversion were attempted in [place] but failed.   He is well rate controlled.  Currently on Toprol-XL 25mg  a day.  Off of 100 mg amiodarone.  Seems to be under good rate control.  Chronic anticoagulation - Now having some trouble with cost of Eliquis.  We are trying to work with him on this and figure out a strategy for him.  If he must, he may have to go back to Coumadin with Dr. Isac Sarna office.  Essential hypertension - Very well, no changes  Aortic sclerosis with no stenosis, mild aortic regurgitation -Murmur heard on exam. Mild  Hyperlipidemia - Continuing with atorvastatin.  LDL 55. No changes.  Hopefully edema will continue to improve in his right leg post surgery.  Usually this takes a few months.  Medication Adjustments/Labs and Tests Ordered: Current medicines are reviewed at length with the patient today.  Concerns regarding medicines are outlined above.  No orders of the defined  types were placed in this encounter.  No orders of the defined types were placed in this encounter.   Patient Instructions  Medication  Instructions:  The current medical regimen is effective;  continue present plan and medications.  If you need a refill on your cardiac medications before your next appointment, please call your pharmacy.   Follow-Up: At [facility] , you and your health needs are our priority.  As part of our continuing mission to provide you with exceptional heart care, we have created designated Provider Care Teams.  These Care Teams include your primary Cardiologist (physician) and Advanced Practice Providers (APPs -  Physician Assistants and Nurse Practitioners) who all work together to provide you with the care you need, when you need it. You will need a follow up appointment in 6 months with Nada Boozer, NP and 12 months with Dr Anne Fu.  Please call our office 2 months in advance to schedule this appointment.  You may see Donato Schultz, MD or one of the following Advanced Practice Providers on your designated Care Team:   Norma Fredrickson, NP Nada Boozer, NP . Georgie Chard, NP  Thank you for choosing [facility]!!         Signed, Donato Schultz, MD  04/24/2018 2:54 PM    [facility]

## 2018-04-24 NOTE — Patient Instructions (Signed)
Medication Instructions:  The current medical regimen is effective;  continue present plan and medications.  If you need a refill on your cardiac medications before your next appointment, please call your pharmacy.   Follow-Up: At [facility], you and your health needs are our priority.  As part of our continuing mission to provide you with exceptional heart care, we have created designated Provider Care Teams.  These Care Teams include your primary Cardiologist (physician) and Advanced Practice Providers (APPs -  Physician Assistants and Nurse Practitioners) who all work together to provide you with the care you need, when you need it. You will need a follow up appointment in 6 months with Laura Ingold, NP and 12 months with Dr Skains.  Please call our office 2 months in advance to schedule this appointment.  You may see Mark Skains, MD or one of the following Advanced Practice Providers on your designated Care Team:   Lori Gerhardt, NP Laura Ingold, NP . Jill McDaniel, NP  Thank you for choosing [facility]!!     

## 2018-04-28 NOTE — Telephone Encounter (Signed)
**Note De-Identified Shanna Un Obfuscation** I have done a Eliquis tier exception through covermymeds. Key: [id number]

## 2018-04-28 NOTE — Telephone Encounter (Signed)
I called [facility] and was advised that the pts Eliquis has gone from $141 for a 90 day supply to $352 and that the pt may have a deductible.  I called the pt to discuss and he stated that he simply cannot afford Eliquis at the current cost as he lives on a fixed income.  He states that he is open to going back on Warfarin if need be.  I will attempt a tier exception to see if we can get the cost down.

## 2018-05-02 NOTE — Telephone Encounter (Signed)
**Note De-Identified Cayce Paschal Obfuscation** We received a letter from [employer] stating that they are upholding their decision to deny this Eliquis tier exception. Reason: Eliquis is already at a tier 3 and there is no brand medications on a lower tier. We dont cover brand medications at a generic cost share and the lowest tier for brand medication is a 3. Since this medication is already covered on the lowest available brand tier, we cant give you a lower price.  Will forward this message to Dr Anne Fu and his nurse for advisement to the pt.

## 2018-05-02 NOTE — Telephone Encounter (Signed)
Spoke with patient who is aware since Eliquis is now too expensive for him, him may switch back to Warfarin.  Pt will contact Dr Kevan Ny office to obtain appts, instructions and RX.  He was grateful for the c/b and information.

## 2018-05-02 NOTE — Telephone Encounter (Signed)
In the past when pt was on Warfarin - his PCP, Dr Kevan Ny, monitored it.

## 2018-05-02 NOTE — Telephone Encounter (Signed)
I am fine with him having coumdin run by Dr. Kevan Ny. Thanks Donato Schultz, MD

## 2018-05-13 ENCOUNTER — Telehealth (INDEPENDENT_AMBULATORY_CARE_PROVIDER_SITE_OTHER): Payer: Self-pay

## 2018-05-13 ENCOUNTER — Other Ambulatory Visit (INDEPENDENT_AMBULATORY_CARE_PROVIDER_SITE_OTHER): Payer: Self-pay | Admitting: Orthopaedic Surgery

## 2018-05-13 MED ORDER — TRAMADOL HCL 50 MG PO TABS: 50.0000 mg | ORAL_TABLET | Freq: Three times a day (TID) | ORAL | 0 refills | Status: DC | PRN

## 2018-05-13 NOTE — Telephone Encounter (Signed)
I'll send some in. 

## 2018-05-13 NOTE — Telephone Encounter (Signed)
Requests refill of Tramadol 

## 2018-05-27 DIAGNOSIS — I4891 Unspecified atrial fibrillation: Secondary | ICD-10-CM | POA: Diagnosis not present

## 2018-05-27 DIAGNOSIS — N400 Enlarged prostate without lower urinary tract symptoms: Secondary | ICD-10-CM | POA: Diagnosis not present

## 2018-05-27 DIAGNOSIS — K219 Gastro-esophageal reflux disease without esophagitis: Secondary | ICD-10-CM | POA: Diagnosis not present

## 2018-05-27 DIAGNOSIS — I2510 Atherosclerotic heart disease of native coronary artery without angina pectoris: Secondary | ICD-10-CM | POA: Diagnosis not present

## 2018-05-27 DIAGNOSIS — E039 Hypothyroidism, unspecified: Secondary | ICD-10-CM | POA: Diagnosis not present

## 2018-05-27 DIAGNOSIS — I10 Essential (primary) hypertension: Secondary | ICD-10-CM | POA: Diagnosis not present

## 2018-07-07 ENCOUNTER — Other Ambulatory Visit (INDEPENDENT_AMBULATORY_CARE_PROVIDER_SITE_OTHER): Payer: Self-pay | Admitting: Orthopaedic Surgery

## 2018-07-07 NOTE — Telephone Encounter (Signed)
Please advise 

## 2018-07-16 ENCOUNTER — Ambulatory Visit (INDEPENDENT_AMBULATORY_CARE_PROVIDER_SITE_OTHER): Payer: Medicare HMO

## 2018-07-16 ENCOUNTER — Ambulatory Visit: Payer: Medicare HMO | Admitting: Orthopaedic Surgery

## 2018-07-16 ENCOUNTER — Other Ambulatory Visit: Payer: Self-pay

## 2018-07-16 ENCOUNTER — Encounter: Payer: Self-pay | Admitting: Orthopaedic Surgery

## 2018-07-16 DIAGNOSIS — Z96651 Presence of right artificial knee joint: Secondary | ICD-10-CM

## 2018-07-16 DIAGNOSIS — M1712 Unilateral primary osteoarthritis, left knee: Secondary | ICD-10-CM

## 2018-07-16 MED ORDER — METHYLPREDNISOLONE ACETATE 40 MG/ML IJ SUSP
40.0000 mg | INTRAMUSCULAR | Status: AC | PRN
  Administered 2018-07-16: 40 mg via INTRA_ARTICULAR

## 2018-07-16 MED ORDER — TRAMADOL HCL 50 MG PO TABS: 50.0000 mg | ORAL_TABLET | Freq: Three times a day (TID) | ORAL | 0 refills | Status: AC | PRN

## 2018-07-16 MED ORDER — LIDOCAINE HCL 1 % IJ SOLN
3.0000 mL | INTRAMUSCULAR | Status: AC | PRN
  Administered 2018-07-16: 3 mL

## 2018-07-16 NOTE — Progress Notes (Signed)
Office Visit Note   Patient: Danny Nelson           Date of Birth: 1929/09/02           MRN: [id number] Visit Date: 07/16/2018              Requested by: Marden Noble, MD [address] [place], [place] [address] PCP: Marden Noble, MD   Assessment & Plan: Visit Diagnoses:  1. History of total right knee replacement     Plan: Patient will be moving south of [place] and therefore will find a new orthopedic surgeon there.  He does not wish to follow-up at this time with [place] in 6 months for follow-up of the right knee.  He understands that he should wait at least 3 months between injections in the left knee.  Questions were encouraged and answered.  Follow-Up Instructions: Return if symptoms worsen or fail to improve.   Orders:  Orders Placed This Encounter  Procedures  . Large Joint Inj  . XR Knee 1-2 Views Right   No orders of the defined types were placed in this encounter.     Procedures: Large Joint Inj: L knee on 07/16/2018 2:21 PM Indications: pain Details: 25 G 1.5 in needle, anterolateral approach  Arthrogram: No  Medications: 3 mL lidocaine 1 %; 40 mg methylPREDNISolone acetate 40 MG/ML Outcome: tolerated well, no immediate complications Procedure, treatment alternatives, risks and benefits explained, specific risks discussed. Consent was given by the patient. Immediately prior to procedure a time out was called to verify the correct patient, procedure, equipment, support staff and site/side marked as required. Patient was prepped and draped in the usual sterile fashion.       Clinical Data: No additional findings.   Subjective: Chief Complaint  Patient presents with  . Right Knee - Follow-up    HPI [name] returns today 5 months status post right total knee arthroplasty.  Right knee is overall doing well.  He states the knee on the right is gets tired but not daily.  Is having pain in the left knee mostly medial compartment.  Pain does  awaken him at night.  He has taken some tramadol which is helped with this to some degree. Review of Systems Denies any fevers or chills.  Objective: Vital Signs: There were no vitals taken for this visit.  Physical Exam General: Well-developed well-nourished male in no acute distress Ortho Exam Right knee full extension flexion to approximately 110 115 degrees no instability valgus varus stressing surgical incisions well-healed calf supple nontender. Left knee overall good range of motion.  No instability valgus varus stressing.  Tenderness along medial joint line.  No effusion abnormal warmth or erythema. Specialty Comments:  No specialty comments available.  Imaging: Xr Knee 1-2 Views Right  Result Date: 07/16/2018  Left knee AP, Right knee AP and lateral views: Left knee no acute fracture.  Moderate compartmental narrowing medial compartment.  Lateral compartment well-preserved. Right knee AP and lateral views show well-seated total knee components.  No bony abnormalities.  No acute fractures.    PMFS History: Patient Active Problem List   Diagnosis Date Noted  . Unilateral primary osteoarthritis, right knee 02/28/2018  . Status post total knee replacement, right 02/28/2018  . Unilateral primary osteoarthritis, left knee 12/10/2017   Past Medical History:  Diagnosis Date  . Anticoagulant long-term use   . Anticoagulation goal of INR 2 to 3   . Atrial fibrillation (HCC)   . BPH (  benign prostatic hyperplasia)   . CAD (coronary artery disease)   . Complication of anesthesia    for throat surgery, was nasallyngiven cocaine and had a reaction from this -tachycardia and vomiting , then for spinal for another surgery, had problems with voiding-nneded a catheter  . Dyspnea on exertion   . Dysrhythmia    A-Fib  . Hypertension   . Hypothyroidism   . Hypovitaminosis D   . Leg edema   . Osteoarthritis     History reviewed. No pertinent family history.  Past Surgical History:   Procedure Laterality Date  . CHOLECYSTECTOMY    . CORONARY ANGIOPLASTY     2 cardiac stents  . left foot surgery  1989   for spurs  . left shoulder surgery     torn left shoulder ligament  . right rotator cuff     right shoulder  . TOTAL KNEE ARTHROPLASTY Right 02/28/2018   Procedure: RIGHT TOTAL KNEE ARTHROPLASTY;  Surgeon: Kathryne HitchBlackman, Christopher Y, MD;  Location: [facility];  Service: Orthopedics;  Laterality: Right;  . vocal cord surgery  2003   Social History   Occupational History  . Not on file  Tobacco Use  . Smoking status: Never Smoker  . Smokeless tobacco: Never Used  Substance and Sexual Activity  . Alcohol use: Never    Frequency: Never  . Drug use: Never  . Sexual activity: Not on file

## 2018-07-16 NOTE — Addendum Note (Signed)
Addended by: Richardean Canal on: 07/16/2018 03:25 PM   Modules accepted: Orders

## 2018-07-19 ENCOUNTER — Telehealth: Payer: Self-pay

## 2018-07-19 ENCOUNTER — Other Ambulatory Visit: Payer: Medicare HMO

## 2018-07-19 DIAGNOSIS — Z20822 Contact with and (suspected) exposure to covid-19: Secondary | ICD-10-CM

## 2018-07-19 NOTE — Telephone Encounter (Signed)
Telephone call to Patient.  Offered an appointment to Patient Patient states that he will need to talk with  His son about it.. Provided Patient with telephone number that he may call to make an appointment.  Voiced understanding.

## 2018-07-19 NOTE — Addendum Note (Signed)
Addended by: Florestine Avers on: 07/19/2018 11:28 AM   Modules accepted: Orders

## 2018-07-19 NOTE — Telephone Encounter (Signed)
Patient showed up at testing site / Order placed and patient put on the schedule.

## 2018-07-21 LAB — NOVEL CORONAVIRUS, NAA: SARS-CoV-2, NAA: NOT DETECTED

## 2018-07-23 DIAGNOSIS — I4891 Unspecified atrial fibrillation: Secondary | ICD-10-CM | POA: Diagnosis not present

## 2018-07-23 DIAGNOSIS — K219 Gastro-esophageal reflux disease without esophagitis: Secondary | ICD-10-CM | POA: Diagnosis not present

## 2018-07-23 DIAGNOSIS — I509 Heart failure, unspecified: Secondary | ICD-10-CM | POA: Diagnosis not present

## 2018-07-23 DIAGNOSIS — I110 Hypertensive heart disease with heart failure: Secondary | ICD-10-CM | POA: Diagnosis not present

## 2018-07-23 DIAGNOSIS — E039 Hypothyroidism, unspecified: Secondary | ICD-10-CM | POA: Diagnosis not present

## 2018-07-23 DIAGNOSIS — J309 Allergic rhinitis, unspecified: Secondary | ICD-10-CM | POA: Diagnosis not present

## 2018-07-23 DIAGNOSIS — G8929 Other chronic pain: Secondary | ICD-10-CM | POA: Diagnosis not present

## 2018-07-23 DIAGNOSIS — E785 Hyperlipidemia, unspecified: Secondary | ICD-10-CM | POA: Diagnosis not present

## 2018-07-23 DIAGNOSIS — K5900 Constipation, unspecified: Secondary | ICD-10-CM | POA: Diagnosis not present

## 2018-07-23 DIAGNOSIS — G4700 Insomnia, unspecified: Secondary | ICD-10-CM | POA: Diagnosis not present

## 2018-09-02 ENCOUNTER — Telehealth: Payer: Self-pay | Admitting: Cardiology

## 2018-09-02 NOTE — Telephone Encounter (Signed)

## 2018-09-03 ENCOUNTER — Telehealth: Payer: Self-pay

## 2018-09-03 NOTE — Telephone Encounter (Signed)
    COVID-19 Pre-Screening Questions:  . In the past 7 to 10 days have you had a cough,  shortness of breath, headache, congestion, fever (100 or greater) body aches, chills, sore throat, or sudden loss of taste or sense of smell? No . Have you been around anyone with known Covid 19. NO . Have you been around anyone who is awaiting Covid 19 test results in the past 7 to 10 days? NO . Have you been around anyone who has been exposed to Covid 19, or has mentioned symptoms of Covid 19 within the past 7 to 10 days? NO  If you have any concerns/questions about symptoms patients report during screening (either on the phone or at threshold). Contact the provider seeing the patient or DOD for further guidance.  If neither are available contact a member of the leadership team.           

## 2018-09-04 ENCOUNTER — Other Ambulatory Visit: Payer: Self-pay

## 2018-09-04 ENCOUNTER — Encounter: Payer: Self-pay | Admitting: Cardiology

## 2018-09-04 ENCOUNTER — Ambulatory Visit: Payer: Medicare HMO | Admitting: Cardiology

## 2018-09-04 VITALS — BP 132/70 | HR 87 | Ht 69.0 in | Wt 197.0 lb

## 2018-09-04 DIAGNOSIS — I482 Chronic atrial fibrillation, unspecified: Secondary | ICD-10-CM

## 2018-09-04 DIAGNOSIS — I2510 Atherosclerotic heart disease of native coronary artery without angina pectoris: Secondary | ICD-10-CM | POA: Diagnosis not present

## 2018-09-04 DIAGNOSIS — Z79899 Other long term (current) drug therapy: Secondary | ICD-10-CM | POA: Diagnosis not present

## 2018-09-04 DIAGNOSIS — I4821 Permanent atrial fibrillation: Secondary | ICD-10-CM

## 2018-09-04 LAB — CBC
Hematocrit: 31.2 % — ABNORMAL LOW (ref 37.5–51.0)
Hemoglobin: 9.9 g/dL — ABNORMAL LOW (ref 13.0–17.7)
MCH: 27 pg (ref 26.6–33.0)
MCHC: 31.7 g/dL (ref 31.5–35.7)
MCV: 85 fL (ref 79–97)
Platelets: 254 10*3/uL (ref 150–450)
RBC: 3.66 x10E6/uL — ABNORMAL LOW (ref 4.14–5.80)
RDW: 15.3 % (ref 11.6–15.4)
WBC: 8.3 10*3/uL (ref 3.4–10.8)

## 2018-09-04 LAB — BASIC METABOLIC PANEL
BUN/Creatinine Ratio: 16 (ref 10–24)
BUN: 16 mg/dL (ref 8–27)
CO2: 22 mmol/L (ref 20–29)
Calcium: 9 mg/dL (ref 8.6–10.2)
Chloride: 105 mmol/L (ref 96–106)
Creatinine, Ser: 1 mg/dL (ref 0.76–1.27)
GFR calc Af Amer: 78 mL/min/{1.73_m2} (ref >59)
GFR calc non Af Amer: 68 mL/min/{1.73_m2} (ref >59)
Glucose: 73 mg/dL (ref 65–99)
Potassium: 3.6 mmol/L (ref 3.5–5.2)
Sodium: 140 mmol/L (ref 134–144)

## 2018-09-04 LAB — LIPID PANEL
Chol/HDL Ratio: 2.1 ratio (ref 0.0–5.0)
Cholesterol, Total: 94 mg/dL — ABNORMAL LOW (ref 100–199)
HDL: 45 mg/dL (ref >39)
LDL Calculated: 40 mg/dL (ref 0–99)
Triglycerides: 47 mg/dL (ref 0–149)
VLDL Cholesterol Cal: 9 mg/dL (ref 5–40)

## 2018-09-04 LAB — ALT: ALT: 15 IU/L (ref 0–44)

## 2018-09-04 NOTE — Progress Notes (Signed)
Cardiology Office Note:    Date:  09/04/2018   ID:  Danny PouchDale Riggle, DOB Feb 15, 1930, MRN [id number]  PCP:  Marden NobleGates, Robert, MD  Cardiologist:  Donato SchultzMark Lendell Gallick, MD  Electrophysiologist:  None   Referring MD: Marden NobleGates, Robert, MD   No chief complaint on file. Atrial fibrillation follow-up  History of Present Illness:    Danny Nelson is a 85 y.o. male here for coronary artery disease and atrial fibrillation follow-up  Prior stress test 10/28/2017 low risk with no ischemia EF 52.  Prior atrial fibrillation on echocardiogram with EF 55% aortic valve sclerosis with no stenosis, mild AI, mild pulmonary hypertension.  Previously had cardioversion and attempted atrial fibrillation ablation 2011 but these measures failed.  Amiodarone therapy 100 mg since then.  Has been on Coumadin followed by Dr. Kevan NyGates.  [place] in [place].  Walking a mile a day - steps are hard.   Breathing a little worse. Son and he moving to [place]. When helping.  New knee 02/2018 - Dr. Magnus IvanBlackman, very pleased.     Past Medical History:  Diagnosis Date  . Anticoagulant long-term use   . Anticoagulation goal of INR 2 to 3   . Atrial fibrillation (HCC)   . BPH (benign prostatic hyperplasia)   . CAD (coronary artery disease)   . Complication of anesthesia    for throat surgery, was nasallyngiven cocaine and had a reaction from this -tachycardia and vomiting , then for spinal for another surgery, had problems with voiding-nneded a catheter  . Dyspnea on exertion   . Dysrhythmia    A-Fib  . Hypertension   . Hypothyroidism   . Hypovitaminosis D   . Leg edema   . Osteoarthritis     Past Surgical History:  Procedure Laterality Date  . CHOLECYSTECTOMY    . CORONARY ANGIOPLASTY     2 cardiac stents  . left foot surgery  1989   for spurs  . left shoulder surgery     torn left shoulder ligament  . right rotator cuff     right shoulder  . TOTAL KNEE ARTHROPLASTY Right 02/28/2018   Procedure: RIGHT TOTAL  KNEE ARTHROPLASTY;  Surgeon: Kathryne HitchBlackman, Christopher Y, MD;  Location: [facility];  Service: Orthopedics;  Laterality: Right;  . vocal cord surgery  2003    Current Medications: Current Meds  Medication Sig  . acetaminophen (TYLENOL) 650 MG CR tablet Take 650 mg by mouth every 8 (eight) hours as needed for pain.  Marland Kitchen. atorvastatin (LIPITOR) 20 MG tablet Take 20 mg by mouth at bedtime.   . cetirizine (ZYRTEC) 10 MG tablet Take 10 mg by mouth daily.  . Cholecalciferol (VITAMIN D) 50 MCG (2000 UT) CAPS Take 2,000 Units by mouth daily.  . diphenhydramine-acetaminophen (TYLENOL PM) 25-500 MG TABS tablet Take 2 tablets by mouth at bedtime.  . docusate sodium (COLACE) 100 MG capsule Take 100 mg by mouth at bedtime.  . finasteride (PROSCAR) 5 MG tablet Take 5 mg by mouth daily.   . furosemide (LASIX) 40 MG tablet Take 1 tablet (40 mg total) by mouth daily. (Patient taking differently: Take 40 mg by mouth as needed. )  . gabapentin (NEURONTIN) 100 MG capsule Take 100-200 mg by mouth See admin instructions. Take 100 mg by mouth in the morning and 200 mg at night  . levothyroxine (SYNTHROID) 75 MCG tablet Take 75 mcg by mouth daily.  Marland Kitchen. losartan (COZAAR) 50 MG tablet Take 1 tablet (50 mg total) by mouth daily.  .Marland Kitchen  metoprolol succinate (TOPROL-XL) 25 MG 24 hr tablet Take 25 mg by mouth daily.   . Multiple Vitamin (MULTIVITAMIN WITH MINERALS) TABS tablet Take 1 tablet by mouth daily.  . nitroGLYCERIN (NITROSTAT) 0.4 MG SL tablet Place 0.4 mg under the tongue every 5 (five) minutes as needed for chest pain.  . Omega-3 1000 MG CAPS Take 1,000 mg by mouth daily.  Marland Kitchen. omeprazole (PRILOSEC) 40 MG capsule Take 1 capsule (40 mg total) by mouth daily before breakfast.  . potassium chloride SA (K-DUR,KLOR-CON) 20 MEQ tablet Take 1 tablet (20 mEq total) by mouth daily. (Patient taking differently: Take 20 mEq by mouth as needed. )  . tamsulosin (FLOMAX) 0.4 MG CAPS capsule Take 0.4 mg by mouth daily after supper.  . traMADol  (ULTRAM) 50 MG tablet Take 1 tablet (50 mg total) by mouth 3 (three) times daily as needed.  . vitamin C (ASCORBIC ACID) 500 MG tablet Take 500 mg by mouth daily.  Marland Kitchen. warfarin (COUMADIN) 3 MG tablet Take 3 mg by mouth daily.     Allergies:   Cocaine hcl and Sudafed [pseudoephedrine hcl]   Social History   Socioeconomic History  . Marital status: Married    Spouse name: Not on file  . Number of children: 5  . Years of education: Not on file  . Highest education level: Not on file  Occupational History  . Not on file  Social Needs  . Financial resource strain: Not on file  . Food insecurity    Worry: Not on file    Inability: Not on file  . Transportation needs    Medical: Not on file    Non-medical: Not on file  Tobacco Use  . Smoking status: Never Smoker  . Smokeless tobacco: Never Used  Substance and Sexual Activity  . Alcohol use: Never    Frequency: Never  . Drug use: Never  . Sexual activity: Not on file  Lifestyle  . Physical activity    Days per week: Not on file    Minutes per session: Not on file  . Stress: Not on file  Relationships  . Social Musicianconnections    Talks on phone: Not on file    Gets together: Not on file    Attends religious service: Not on file    Active member of club or organization: Not on file    Attends meetings of clubs or organizations: Not on file    Relationship status: Not on file  Other Topics Concern  . Not on file  Social History Narrative  . Not on file     Family History: The patient's family history is not on file.  No early family history of CAD  ROS:   Please see the history of present illness.    No fevers chills nausea vomiting syncope bleeding all other systems reviewed and are negative.  EKGs/Labs/Other Studies Reviewed:    The following studies were reviewed today: See above  ECHO 2019  - The patient was in atrial fibrillation. Normal LV size with mild   focal basal septal hypertrophy. EF 55%. Normal RV size  and   systolic function. Aortic valve sclerosis without significant   stenosis. Mild aortic insufficiency. Mild pulmonary hypertension.  Nuclear stress test 2019  Nuclear stress EF: 52%.  Normal perfusion with mild soft tissue attenuation (diaphragm) No ischemia or scar  This is a low risk study.     EKG:  EKG is  ordered today.  The ekg ordered today  demonstrates atrial fibrillation heart rate 87 bpm PVC.  No change from prior EKG last year.  Recent Labs: 03/01/2018: BUN 16; Creatinine, Ser 1.10; Potassium 3.6; Sodium 138 03/03/2018: Hemoglobin 9.0; Platelets 211  Recent Lipid Panel No results found for: CHOL, TRIG, HDL, CHOLHDL, VLDL, LDLCALC, LDLDIRECT   LDL 55 hemoglobin 9 on 03/03/2018  Physical Exam:    VS:  BP 132/70   Pulse 87   Ht 5\' 9"  (1.753 m)   Wt 197 lb (89.4 kg)   SpO2 95%   BMI 29.09 kg/m     Wt Readings from Last 3 Encounters:  09/04/18 197 lb (89.4 kg)  04/24/18 199 lb 6.4 oz (90.4 kg)  02/28/18 200 lb 9.9 oz (91 kg)     GEN:  Well nourished, well developed in no acute distress HEENT: Normal NECK: No JVD; No carotid bruits LYMPHATICS: No lymphadenopathy CARDIAC: irreg irreg, no murmurs, rubs, gallops RESPIRATORY:  Clear to auscultation without rales, wheezing or rhonchi  ABDOMEN: Soft, non-tender, non-distended MUSCULOSKELETAL:  No edema; No deformity  SKIN: Warm and dry NEUROLOGIC:  Alert and oriented x 3 PSYCHIATRIC:  Normal affect   ASSESSMENT:    1. Permanent atrial fibrillation   2. Chronic atrial fibrillation   3. Medication management   4. Coronary artery disease involving native coronary artery of native heart without angina pectoris    PLAN:    In order of problems listed above:  Shortness of breath  - ?anemia. Last Hg 9.  I will recheck while he is here.  If it still remains low,, certainly will forward to Dr. Kevan NyGates for further evaluation.  His last echocardiogram was overall reassuring.  Stress test also was reassuring.   Conditioning may be playing a role as well.  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  Not having any anginal symptoms.  Feeling quite well.  Continue with secondary risk factor prevention.  No current symptoms other than shortness of breath, question anemia.  No chest pain.  Permanent atrial fibrillation - Both atrial fibrillation ablation and cardioversion were attempted in [place] but failed.   He is well rate controlled.  Currently on Toprol-XL 25mg  a day.  Off of 100 mg amiodarone.  Seems to be under good rate control.  No changes made.  EKG reassuring.  Chronic anticoagulation -  Had trouble with cost of Eliquis.  We are trying to work with him on this and figure out a strategy for him.  If he must, he may have to go back to Coumadin with Dr. Isac SarnaGates's office.  No bleeding issues however I am concerned about his previous hemoglobin in January.  We will recheck today.  Essential hypertension - Very well, no changes, well controlled  Aortic sclerosis with no stenosis, mild aortic regurgitation -Murmur heard on exam. Mild, should be of no clinical significance at this point.  Hyperlipidemia - Continuing with atorvastatin.  LDL previously 55. No changes.  No myalgias noted.    Medication Adjustments/Labs and Tests Ordered: Current medicines are reviewed at length with the patient today.  Concerns regarding medicines are outlined above.  Orders Placed This Encounter  Procedures  . Basic metabolic panel  . CBC  . ALT  . Lipid panel  . EKG 12-Lead   No orders of the defined types were placed in this encounter.   Patient Instructions  Medication Instructions:  The current medical regimen is effective;  continue present plan  and medications.  If you need a refill on your cardiac medications before your next appointment, please call your pharmacy.   Lab work: Please have blood work today. If you have  labs (blood work) drawn today and your tests are completely normal, you will receive your results only by: Marland Kitchen MyChart Message (if you have MyChart) OR . A paper copy in the mail If you have any lab test that is abnormal or we need to change your treatment, we will call you to review the results.  Follow-Up: At [facility], you and your health needs are our priority.  As part of our continuing mission to provide you with exceptional heart care, we have created designated Provider Care Teams.  These Care Teams include your primary Cardiologist (physician) and Advanced Practice Providers (APPs -  Physician Assistants and Nurse Practitioners) who all work together to provide you with the care you need, when you need it. You will need a follow up appointment in 6 months.  Please call our office 2 months in advance to schedule this appointment.  You may see Candee Furbish, MD or one of the following Advanced Practice Providers on your designated Care Team:   Truitt Merle, NP Cecilie Kicks, NP . Kathyrn Drown, NP  Thank you for choosing [facility]!!         Signed, Candee Furbish, MD  09/04/2018 9:51 AM    [place]

## 2018-09-04 NOTE — Patient Instructions (Signed)
Medication Instructions:  The current medical regimen is effective;  continue present plan and medications.  If you need a refill on your cardiac medications before your next appointment, please call your pharmacy.   Lab work: Please have blood work today. If you have labs (blood work) drawn today and your tests are completely normal, you will receive your results only by: Marland Kitchen MyChart Message (if you have MyChart) OR . A paper copy in the mail If you have any lab test that is abnormal or we need to change your treatment, we will call you to review the results.  Follow-Up: At [facility], you and your health needs are our priority.  As part of our continuing mission to provide you with exceptional heart care, we have created designated Provider Care Teams.  These Care Teams include your primary Cardiologist (physician) and Advanced Practice Providers (APPs -  Physician Assistants and Nurse Practitioners) who all work together to provide you with the care you need, when you need it. You will need a follow up appointment in 6 months.  Please call our office 2 months in advance to schedule this appointment.  You may see Candee Furbish, MD or one of the following Advanced Practice Providers on your designated Care Team:   Truitt Merle, NP Cecilie Kicks, NP . Kathyrn Drown, NP  Thank you for choosing [facility]!!

## 2018-09-09 DIAGNOSIS — M9905 Segmental and somatic dysfunction of pelvic region: Secondary | ICD-10-CM | POA: Diagnosis not present

## 2018-09-09 DIAGNOSIS — M9904 Segmental and somatic dysfunction of sacral region: Secondary | ICD-10-CM | POA: Diagnosis not present

## 2018-09-09 DIAGNOSIS — M5136 Other intervertebral disc degeneration, lumbar region: Secondary | ICD-10-CM | POA: Diagnosis not present

## 2018-09-09 DIAGNOSIS — M9903 Segmental and somatic dysfunction of lumbar region: Secondary | ICD-10-CM | POA: Diagnosis not present

## 2018-12-03 ENCOUNTER — Other Ambulatory Visit: Payer: Self-pay | Admitting: Cardiology

## 2018-12-05 DIAGNOSIS — R69 Illness, unspecified: Secondary | ICD-10-CM | POA: Diagnosis not present

## 2018-12-30 ENCOUNTER — Other Ambulatory Visit: Payer: Self-pay | Admitting: Cardiology

## 2018-12-31 ENCOUNTER — Telehealth: Payer: Self-pay | Admitting: Cardiology

## 2018-12-31 NOTE — Telephone Encounter (Signed)
Spoke with pt and notes SOB for long period and this is worsening per pt Pt currently taking Furosemide 40 mg  Qod starting today is going to take daily Per pt notes swelling in ankles but not bad the patient is requesting appt  Appt made for tom at 3:20 with Dr Stevphen Meuse

## 2018-12-31 NOTE — Telephone Encounter (Signed)
New Message     Pt c/o Shortness Of Breath: STAT if SOB developed within the last 24 hours or pt is noticeably SOB on the phone  1. Are you currently SOB (can you hear that pt is SOB on the phone)? Yes, SOB is increasing   2. How long have you been experiencing SOB? Getting worse over the last 2-3 months   3. Are you SOB when sitting or when up moving around? Moving around, or stairs or walking up the driveway he will have to stop to catch his breath   4. Are you currently experiencing any other symptoms? Pt says he has no pain just tires and sluggish  He would like to see Dr Marlou Porch today or tomorrow

## 2019-01-01 ENCOUNTER — Ambulatory Visit: Payer: Medicare HMO | Admitting: Cardiology

## 2019-01-01 ENCOUNTER — Other Ambulatory Visit: Payer: Self-pay

## 2019-01-01 ENCOUNTER — Encounter: Payer: Self-pay | Admitting: Cardiology

## 2019-01-01 VITALS — BP 130/60 | HR 56 | Ht 69.0 in | Wt 194.6 lb

## 2019-01-01 DIAGNOSIS — Z79899 Other long term (current) drug therapy: Secondary | ICD-10-CM | POA: Diagnosis not present

## 2019-01-01 DIAGNOSIS — I4821 Permanent atrial fibrillation: Secondary | ICD-10-CM

## 2019-01-01 DIAGNOSIS — I2510 Atherosclerotic heart disease of native coronary artery without angina pectoris: Secondary | ICD-10-CM | POA: Diagnosis not present

## 2019-01-01 DIAGNOSIS — R0602 Shortness of breath: Secondary | ICD-10-CM | POA: Diagnosis not present

## 2019-01-01 NOTE — Patient Instructions (Addendum)
Medication Instructions:  The current medical regimen is effective;  continue present plan and medications.  *If you need a refill on your cardiac medications before your next appointment, please call your pharmacy*  Lab Work: Please have blood work today (CBC, BMP) If you have labs (blood work) drawn today and your tests are completely normal, you will receive your results only by: Marland Kitchen MyChart Message (if you have MyChart) OR . A paper copy in the mail If you have any lab test that is abnormal or we need to change your treatment, we will call you to review the results.  Testing/Procedures: Your physician has requested that you have an echocardiogram. Echocardiography is a painless test that uses sound waves to create images of your heart. It provides your doctor with information about the size and shape of your heart and how well your heart's chambers and valves are working. This procedure takes approximately one hour. There are no restrictions for this procedure.  You have been referred to Pulmonary for further evaluation of your shortness of breath.  Follow-Up: At [facility], you and your health needs are our priority.  As part of our continuing mission to provide you with exceptional heart care, we have created designated Provider Care Teams.  These Care Teams include your primary Cardiologist (physician) and Advanced Practice Providers (APPs -  Physician Assistants and Nurse Practitioners) who all work together to provide you with the care you need, when you need it.  Your next appointment:   6 months  The format for your next appointment:   In Person  Provider:   Kathyrn Drown, NP and Dr Marlou Porch in 1 year.  Thank you for choosing [facility]!!

## 2019-01-01 NOTE — Progress Notes (Signed)
Cardiology Office Note:    Date:  01/01/2019   ID:  Danny Nelson, DOB 1929/04/24, MRN [id number]  PCP:  Marden Noble, MD  Cardiologist:  Donato Schultz, MD  Electrophysiologist:  None   Referring MD: Marden Noble, MD   No chief complaint on file. Atrial fibrillation follow-up  History of Present Illness:    Danny Nelson is a 85 y.o. male here for coronary artery disease and atrial fibrillation follow-up, shortness of breath  Prior stress test 10/28/2017 low risk with no ischemia EF 52.  Prior atrial fibrillation on echocardiogram with EF 55% aortic valve sclerosis with no stenosis, mild AI, mild pulmonary hypertension.  Previously had cardioversion and attempted atrial fibrillation ablation 2011 but these measures failed.    Has been on Coumadin followed by Dr. Kevan Ny.  Had stent placed in [place] in 2009.  Walking a mile a day - steps are hard.   Breathing a little worse. Son and he moving to [place]. When helping.  New knee 02/2018 - Dr. Magnus Ivan, very pleased.   01/01/2019 - SOB worse past few months. Stairs stop.  Multiple parking lot here had to stop.  Lasix 40 with KCL, since Wed.  Does not really seem to be changing anything.  He does not appear to be fluid overloaded.  Past Medical History:  Diagnosis Date  . Anticoagulant long-term use   . Anticoagulation goal of INR 2 to 3   . Atrial fibrillation (HCC)   . BPH (benign prostatic hyperplasia)   . CAD (coronary artery disease)   . Complication of anesthesia    for throat surgery, was nasallyngiven cocaine and had a reaction from this -tachycardia and vomiting , then for spinal for another surgery, had problems with voiding-nneded a catheter  . Dyspnea on exertion   . Dysrhythmia    A-Fib  . Hypertension   . Hypothyroidism   . Hypovitaminosis D   . Leg edema   . Osteoarthritis     Past Surgical History:  Procedure Laterality Date  . CHOLECYSTECTOMY    . CORONARY ANGIOPLASTY     2 cardiac stents  . left foot  surgery  1989   for spurs  . left shoulder surgery     torn left shoulder ligament  . right rotator cuff     right shoulder  . TOTAL KNEE ARTHROPLASTY Right 02/28/2018   Procedure: RIGHT TOTAL KNEE ARTHROPLASTY;  Surgeon: Kathryne Hitch, MD;  Location: [facility];  Service: Orthopedics;  Laterality: Right;  . vocal cord surgery  2003    Current Medications: Current Meds  Medication Sig  . acetaminophen (TYLENOL) 650 MG CR tablet Take 650 mg by mouth every 8 (eight) hours as needed for pain.  Marland Kitchen atorvastatin (LIPITOR) 20 MG tablet Take 20 mg by mouth at bedtime.   . cetirizine (ZYRTEC) 10 MG tablet Take 10 mg by mouth daily.  . Cholecalciferol (VITAMIN D) 50 MCG (2000 UT) CAPS Take 2,000 Units by mouth daily.  . diphenhydramine-acetaminophen (TYLENOL PM) 25-500 MG TABS tablet Take 2 tablets by mouth at bedtime.  . docusate sodium (COLACE) 100 MG capsule Take 100 mg by mouth at bedtime.  . finasteride (PROSCAR) 5 MG tablet Take 5 mg by mouth daily.   . furosemide (LASIX) 40 MG tablet Take 1 tablet (40 mg total) by mouth daily.  Marland Kitchen gabapentin (NEURONTIN) 100 MG capsule Take 100-200 mg by mouth See admin instructions. Take 100 mg by mouth in the morning and 200 mg at night  .  levothyroxine (SYNTHROID) 75 MCG tablet Take 75 mcg by mouth daily.  Marland Kitchen losartan (COZAAR) 25 MG tablet TAKE 2 TABLETS(50MG ) BY MOUTH EVERY DAY  . metoprolol succinate (TOPROL-XL) 25 MG 24 hr tablet Take 25 mg by mouth daily.   . Multiple Vitamin (MULTIVITAMIN WITH MINERALS) TABS tablet Take 1 tablet by mouth daily.  . nitroGLYCERIN (NITROSTAT) 0.4 MG SL tablet Place 0.4 mg under the tongue every 5 (five) minutes as needed for chest pain.  . Omega-3 1000 MG CAPS Take 1,000 mg by mouth daily.  Marland Kitchen omeprazole (PRILOSEC) 40 MG capsule TAKE 1 CAPSULE BY MOUTH EVERY DAY BEFORE BREAKFAST  . potassium chloride SA (K-DUR,KLOR-CON) 20 MEQ tablet Take 1 tablet (20 mEq total) by mouth daily.  . tamsulosin (FLOMAX) 0.4 MG CAPS  capsule Take 0.4 mg by mouth daily after supper.  . traMADol (ULTRAM) 50 MG tablet Take 1 tablet (50 mg total) by mouth 3 (three) times daily as needed.  . vitamin C (ASCORBIC ACID) 500 MG tablet Take 500 mg by mouth daily.  Marland Kitchen warfarin (COUMADIN) 3 MG tablet Take 3 mg by mouth daily.     Allergies:   Cocaine hcl and Sudafed [pseudoephedrine hcl]   Social History   Socioeconomic History  . Marital status: Married    Spouse name: Not on file  . Number of children: 5  . Years of education: Not on file  . Highest education level: Not on file  Occupational History  . Not on file  Social Needs  . Financial resource strain: Not on file  . Food insecurity    Worry: Not on file    Inability: Not on file  . Transportation needs    Medical: Not on file    Non-medical: Not on file  Tobacco Use  . Smoking status: Never Smoker  . Smokeless tobacco: Never Used  Substance and Sexual Activity  . Alcohol use: Never    Frequency: Never  . Drug use: Never  . Sexual activity: Not on file  Lifestyle  . Physical activity    Days per week: Not on file    Minutes per session: Not on file  . Stress: Not on file  Relationships  . Social Herbalist on phone: Not on file    Gets together: Not on file    Attends religious service: Not on file    Active member of club or organization: Not on file    Attends meetings of clubs or organizations: Not on file    Relationship status: Not on file  Other Topics Concern  . Not on file  Social History Narrative  . Not on file     Family History: The patient's family history is not on file.  No early family history of CAD  ROS:   Please see the history of present illness.    No fevers chills nausea vomiting syncope bleeding all other systems reviewed and are negative.  EKGs/Labs/Other Studies Reviewed:    The following studies were reviewed today: See above  ECHO 2019  - The patient was in atrial fibrillation. Normal LV size with  mild   focal basal septal hypertrophy. EF 55%. Normal RV size and   systolic function. Aortic valve sclerosis without significant   stenosis. Mild aortic insufficiency. Mild pulmonary hypertension.  Nuclear stress test 2019  Nuclear stress EF: 52%.  Normal perfusion with mild soft tissue attenuation (diaphragm) No ischemia or scar  This is a low risk study.  EKG:  EKG is  ordered today.  The ekg ordered today demonstrates atrial fibrillation heart rate 87 bpm PVC.  No change from prior EKG last year.  Recent Labs: 09/04/2018: ALT 15; BUN 16; Creatinine, Ser 1.00; Hemoglobin 9.9; Platelets 254; Potassium 3.6; Sodium 140  Recent Lipid Panel    Component Value Date/Time   CHOL 94 (L) 09/04/2018 0945   TRIG 47 09/04/2018 0945   HDL 45 09/04/2018 0945   CHOLHDL 2.1 09/04/2018 0945   LDLCALC 40 09/04/2018 0945     LDL 55 hemoglobin 9 on 03/03/2018  Physical Exam:    VS:  BP 130/60   Pulse (!) 56   Ht 5\' 9"  (1.753 m)   Wt 194 lb 9.6 oz (88.3 kg)   SpO2 (!) 87%   BMI 28.74 kg/m     Wt Readings from Last 3 Encounters:  01/01/19 194 lb 9.6 oz (88.3 kg)  09/04/18 197 lb (89.4 kg)  04/24/18 199 lb 6.4 oz (90.4 kg)     GEN:  Well nourished, well developed in no acute distress HEENT: Normal NECK: No JVD; No carotid bruits LYMPHATICS: No lymphadenopathy CARDIAC: irreg irreg, 1/6 SM, no rubs, gallops RESPIRATORY:  Clear to auscultation without rales, wheezing or rhonchi  ABDOMEN: Soft, non-tender, non-distended MUSCULOSKELETAL:  No edema; No deformity  SKIN: Warm and dry NEUROLOGIC:  Alert and oriented x 3 PSYCHIATRIC:  Normal affect   ASSESSMENT:    1. Coronary artery disease involving native coronary artery of native heart without angina pectoris   2. Medication management   3. SOB (shortness of breath)   4. Permanent atrial fibrillation (HCC)    PLAN:    In order of problems listed above:  Shortness of breath -Appointment today is because his shortness of  breath seems to be getting worse  - ?anemia. Last Hg 9-9.9.   His last echocardiogram was overall reassuring.  I will go ahead and repeat echocardiogram since it has been over a year.  Still hearing murmur. stress test also was reassuring.  Conditioning may be playing a role as well.  Seems to be progressively getting worse.  He has never been a smoker, no 06/24/18.  I will send him over to pulmonary for further evaluation.  Could have been prior use of amiodarone?  If pulmonary evaluation is unremarkable, we could always consider cardiac catheterization.  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  Recent stress test showed no evidence of any ischemia.  No chest pain.  Permanent atrial fibrillation - Both atrial fibrillation ablation and cardioversion were attempted in 2010 but failed.   He is well rate controlled.  Currently on Toprol-XL 25mg  a day.  Off of 100 mg amiodarone.  Seems to be under good rate control.  No changes made.  EKG reassuring.  Stable  Chronic anticoagulation -  Had trouble with cost of Eliquis.   No bleeding issues however I am concerned about his previous hemoglobin in January and July.  We will recheck today.  If his hemoglobin remains low, I will encourage Dr. February to continue work-up for this.  May need GI evaluation.  Essential hypertension - Well controlled no changes  Aortic sclerosis with no stenosis, mild aortic regurgitation -Murmur once again heard on exam. Mild, should be of no clinical significance at this point.  Given his progressive shortness of breath, we will repeat echocardiogram.  Hyperlipidemia - Continuing with atorvastatin.  LDL previously 40 in July [id number]. No changes.  No myalgias noted.  Doing well.  Medication Adjustments/Labs and Tests Ordered: Current medicines are reviewed at length with the patient today.  Concerns regarding medicines are outlined above.   Orders Placed This Encounter  Procedures  . Basic Metabolic Panel (BMET)  . CBC  . Ambulatory referral to Pulmonology  . ECHOCARDIOGRAM COMPLETE   No orders of the defined types were placed in this encounter.   Patient Instructions  Medication Instructions:  The current medical regimen is effective;  continue present plan and medications.  *If you need a refill on your cardiac medications before your next appointment, please call your pharmacy*  Lab Work: Please have blood work today (CBC, BMP) If you have labs (blood work) drawn today and your tests are completely normal, you will receive your results only by: Marland Kitchen. MyChart Message (if you have MyChart) OR . A paper copy in the mail If you have any lab test that is abnormal or we need to change your treatment, we will call you to review the results.  Testing/Procedures: Your physician has requested that you have an echocardiogram. Echocardiography is a painless test that uses sound waves to create images of your heart. It provides your doctor with information about the size and shape of your heart and how well your heart's chambers and valves are working. This procedure takes approximately one hour. There are no restrictions for this procedure.  You have been referred to Pulmonary for further evaluation of your shortness of breath.  Follow-Up: At [facility], you and your health needs are our priority.  As part of our continuing mission to provide you with exceptional heart care, we have created designated Provider Care Teams.  These Care Teams include your primary Cardiologist (physician) and Advanced Practice Providers (APPs -  Physician Assistants and Nurse Practitioners) who all work together to provide you with the care you need, when you need it.  Your next appointment:   6 months  The format for your next appointment:   In Person  Provider:   Georgie ChardJill McDaniel, NP and Dr Anne Fu in 1 year.  Thank you for choosing [facility]!!        Signed, Donato SchultzMark , MD  01/01/2019 5:13 PM     [facility]

## 2019-01-02 LAB — BASIC METABOLIC PANEL
BUN/Creatinine Ratio: 15 (ref 10–24)
BUN: 17 mg/dL (ref 8–27)
CO2: 21 mmol/L (ref 20–29)
Calcium: 9 mg/dL (ref 8.6–10.2)
Chloride: 104 mmol/L (ref 96–106)
Creatinine, Ser: 1.11 mg/dL (ref 0.76–1.27)
GFR calc Af Amer: 69 mL/min/{1.73_m2} (ref >59)
GFR calc non Af Amer: 60 mL/min/{1.73_m2} (ref >59)
Glucose: 109 mg/dL — ABNORMAL HIGH (ref 65–99)
Potassium: 3.9 mmol/L (ref 3.5–5.2)
Sodium: 139 mmol/L (ref 134–144)

## 2019-01-02 LAB — CBC
Hematocrit: 29.7 % — ABNORMAL LOW (ref 37.5–51.0)
Hemoglobin: 9.5 g/dL — ABNORMAL LOW (ref 13.0–17.7)
MCH: 25.1 pg — ABNORMAL LOW (ref 26.6–33.0)
MCHC: 32 g/dL (ref 31.5–35.7)
MCV: 78 fL — ABNORMAL LOW (ref 79–97)
Platelets: 322 10*3/uL (ref 150–450)
RBC: 3.79 x10E6/uL — ABNORMAL LOW (ref 4.14–5.80)
RDW: 15.8 % — ABNORMAL HIGH (ref 11.6–15.4)
WBC: 9.6 10*3/uL (ref 3.4–10.8)

## 2019-01-07 ENCOUNTER — Ambulatory Visit: Payer: Medicare HMO | Admitting: Internal Medicine

## 2019-01-07 ENCOUNTER — Other Ambulatory Visit: Payer: Self-pay

## 2019-01-07 ENCOUNTER — Ambulatory Visit (INDEPENDENT_AMBULATORY_CARE_PROVIDER_SITE_OTHER)
Admission: RE | Admit: 2019-01-07 | Discharge: 2019-01-07 | Disposition: A | Discharge status: Home / Self Care | Payer: Medicare HMO | Source: Ambulatory Visit | Attending: Internal Medicine | Admitting: Internal Medicine

## 2019-01-07 ENCOUNTER — Encounter: Payer: Self-pay | Admitting: Internal Medicine

## 2019-01-07 VITALS — BP 112/68 | HR 80 | Temp 98.0°F | Ht 69.0 in | Wt 194.6 lb

## 2019-01-07 DIAGNOSIS — R0602 Shortness of breath: Secondary | ICD-10-CM

## 2019-01-07 DIAGNOSIS — R918 Other nonspecific abnormal finding of lung field: Secondary | ICD-10-CM | POA: Diagnosis not present

## 2019-01-07 NOTE — Progress Notes (Addendum)
Danny Nelson    [id number]    24-May-1929  Primary Care Physician:Gates, Molly Maduro, MD  Referring Physician: Jake Bathe, MD [phone] [address] [place],  [place] [address] Reason for Consultation: "shortness of breath" Date of Consultation: 01/07/2019  Chief complaint:   Chief Complaint  Patient presents with  . Pulmonary Consult    referred by Dr Donato Schultz for evaluation of shortness of breath x2-3 months     HPI: Danny Nelson is an 85 year old gentleman with a history of coronary artery disease and atrial fibrillation who presents for new patient evaluation at the behest of Dr. Anne Fu for dyspnea.  6-7 months of dyspnea on exertion Jan 2020 had a knee replacement and ambulation has been better. He tries to walk for a mile everyday but gets very sob with minimal exertion. No significant cough, wheezing.  Dyspnea relieved with rest.   Used to live in [place] but moved to [place] in April 2019 to be closer to his son.   No previous history of pneumonia, bronchitis, respiratory difficulties.   He does like to play golf and he is usually able to play 9 holes before his A. fib kicks in.  He does have permanent A. Fib and has had multiple EP studies and anti-arrhythmics and has been refractory. He is on warfarin for [facility].   He has had some intentional weight loss going from size 42 to 38.   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 No known asbestos exposure.  Lives in an apartment on his son's property.  Exposures: Been on amiodarone 100 mg twice daily 2011 until 2019 Smoking history: He is a lifelong never smoker, no smokeless tobacco use, no significant passive smoke exposure  Social History   Occupational History  . Not on file  Tobacco Use  . Smoking status: Never Smoker  . Smokeless tobacco: Never Used  Substance and Sexual Activity  . Alcohol use: Never    Frequency: Never   . Drug use: Never  . Sexual activity: Not on file    Relevant family history: Father - Silicosis (was a stone mason.) Also had tuberculosis   Past Medical History:  Diagnosis Date  . Anticoagulant long-term use   . Anticoagulation goal of INR 2 to 3   . Atrial fibrillation (HCC)   . BPH (benign prostatic hyperplasia)   . CAD (coronary artery disease)   . Complication of anesthesia    for throat surgery, was nasallyngiven cocaine and had a reaction from this -tachycardia and vomiting , then for spinal for another surgery, had problems with voiding-nneded a catheter  . Dyspnea on exertion   . Dysrhythmia    A-Fib  . Hypertension   . Hypothyroidism   . Hypovitaminosis D   . Leg edema   . Osteoarthritis     Past Surgical History:  Procedure Laterality Date  . CHOLECYSTECTOMY    . CORONARY ANGIOPLASTY     2 cardiac stents  . left foot surgery  1989   for spurs  . left shoulder surgery     torn left shoulder ligament  . right rotator cuff     right shoulder  . TOTAL KNEE ARTHROPLASTY Right 02/28/2018   Procedure: RIGHT TOTAL KNEE ARTHROPLASTY;  Surgeon: Kathryne Hitch, MD;  Location: [facility];  Service: Orthopedics;  Laterality: Right;  . vocal cord surgery  2003  Review of systems: Review of Systems  Constitutional: Negative for chills, fever and weight loss.       Night sweats once/week requiring changing shirt   HENT: Negative for congestion, sinus pain and sore throat.   Eyes: Negative for discharge and redness.  Respiratory: Positive for shortness of breath. Negative for cough, hemoptysis, sputum production and wheezing.   Cardiovascular: Positive for palpitations. Negative for chest pain and leg swelling.  Gastrointestinal: Negative for heartburn, nausea and vomiting.  Musculoskeletal: Negative for joint pain and myalgias.  Skin: Negative for rash.  Neurological: Negative for dizziness, tremors, focal weakness and headaches.  Endo/Heme/Allergies:  Negative for environmental allergies.  Psychiatric/Behavioral: Negative for depression. The patient is not nervous/anxious.   All other systems reviewed and are negative.   Physical Exam: Blood pressure 112/68, pulse 80, temperature 98 F (36.7 C), temperature source Temporal, height 5\' 9"  (1.753 m), weight 194 lb 9.6 oz (88.3 kg), SpO2 95 %. Gen:      No acute distress Eyes: EOMI, sclera anicteric ENT:  no nasal polyps, mucus membranes moist Neck:     Supple, no thyromegaly Lungs:    No increased respiratory effort, symmetric chest wall excursion, clear to auscultation bilaterally, faint right basilar crackles CV:         Irregularly irregular, no gallops or rubs, there is 2+ pitting edema Abd:      + bowel sounds; soft, non-tender; no distension MSK: no acute synovitis of DIP or PIP joints, no mechanics hands.  Skin:      Red nonblanching morbilliform rash on the right trunk which stops at the midline Neuro: normal speech, no focal facial asymmetry Psych: alert and oriented x3, normal mood and affect  Data Reviewed: Imaging: I have personally reviewed the CT A/P from Dec 2019 which does demonstrated mild bibasilar fine fibrosis without honeycombing.  Stress test in 2019 was low risk for ischemia  Echocardiogram in 2019 demonstrated the patient is still in atrial fibrillation, there is preserved systolic ejection fraction, mild aortic insufficiency, and mild pulmonary hypertension.  PFTs:  No PFTs on file  Labs: Hemoglobin from November 2020 is low at 9.5 Covid test from May 2020 was negative  Assessment:  Shortness of breath Atrial fibrillation on anticoagulation Coronary artery disease status post PCI Heart failure with preserved ejection fraction -he did not take his Lasix today  Plan/Recommendations: [name] has shortness of breath on exertion which seems to have progressed over the last 8 months.  Differential diagnosis includes interstitial lung disease, mild,  neuromuscular weakness.  I very much doubt obstructive lung disease.  He does have some mild right-sided basilar crackles.  At this point will obtain a chest x-ray as well as full pulmonary function testing to determine the presence of restriction.  He may need to hire a CT at some point.  And his exposure to amiodarone, pulmonary toxicity is in the differential but less likely since he stopped it over a year ago.  Return to Care: Return in about 2 months (around 03/09/2019).  Lenice Llamas, MD Pulmonary and [facility]  CC: Jerline Pain, MD

## 2019-01-07 NOTE — Addendum Note (Signed)
Addended by: Parke Poisson E on: 01/07/2019 12:46 PM   Modules accepted: Orders

## 2019-01-12 ENCOUNTER — Other Ambulatory Visit: Payer: Self-pay

## 2019-01-12 ENCOUNTER — Ambulatory Visit (HOSPITAL_COMMUNITY): Payer: Medicare HMO | Attending: Cardiovascular Disease

## 2019-01-12 ENCOUNTER — Telehealth: Payer: Self-pay | Admitting: Internal Medicine

## 2019-01-12 DIAGNOSIS — R0602 Shortness of breath: Secondary | ICD-10-CM | POA: Diagnosis not present

## 2019-01-12 DIAGNOSIS — J849 Interstitial pulmonary disease, unspecified: Secondary | ICD-10-CM

## 2019-01-12 NOTE — Telephone Encounter (Signed)
Will route to [facility] pool.   Per ND schedule at first available.

## 2019-01-12 NOTE — Progress Notes (Signed)
2D Echocardiogram has been performed. 

## 2019-01-12 NOTE — Telephone Encounter (Signed)
Reviewed results of CT scan - will proceed with CT Chest for further clarification. There is concern for scarring on the lung and a CT scan will give [place] more information.

## 2019-01-13 ENCOUNTER — Telehealth: Payer: Self-pay | Admitting: Cardiology

## 2019-01-13 NOTE — Telephone Encounter (Signed)
Notes recorded by Jerline Pain, MD on 01/13/2019 at 5:31 PM EST  Thankfully, the pump function of the heart, ejection fraction is normal at 55 to 60%. This is very reassuring. He has mild aortic valve stenosis which should not be contributing to any shortness of breath. Reassuring overall.  Candee Furbish, MD   Pt also asked about his hemoglobin of 9.5.  Advised according to Dr Marlou Porch notes - he spoke with Dr Inda Merlin who is going to obtain a GI evaluation.  Pt states he is going to call him tomorrow to f/u.

## 2019-01-13 NOTE — Telephone Encounter (Signed)
Pt had echo 11/23.  Results have not been reviewed by Dr Marlou Porch at this time.

## 2019-01-13 NOTE — Telephone Encounter (Signed)
Patient is calling for results

## 2019-01-13 NOTE — Telephone Encounter (Signed)
appt scheduled for 01/23/19 at [facility] lmam for pt

## 2019-01-20 DIAGNOSIS — E039 Hypothyroidism, unspecified: Secondary | ICD-10-CM | POA: Diagnosis not present

## 2019-01-20 DIAGNOSIS — D509 Iron deficiency anemia, unspecified: Secondary | ICD-10-CM | POA: Diagnosis not present

## 2019-01-20 DIAGNOSIS — I4891 Unspecified atrial fibrillation: Secondary | ICD-10-CM | POA: Diagnosis not present

## 2019-01-20 DIAGNOSIS — I2510 Atherosclerotic heart disease of native coronary artery without angina pectoris: Secondary | ICD-10-CM | POA: Diagnosis not present

## 2019-01-20 DIAGNOSIS — I10 Essential (primary) hypertension: Secondary | ICD-10-CM | POA: Diagnosis not present

## 2019-01-20 DIAGNOSIS — R195 Other fecal abnormalities: Secondary | ICD-10-CM | POA: Diagnosis not present

## 2019-01-23 ENCOUNTER — Other Ambulatory Visit: Payer: Self-pay

## 2019-01-23 ENCOUNTER — Ambulatory Visit
Admission: RE | Admit: 2019-01-23 | Discharge: 2019-01-23 | Disposition: A | Discharge status: Home / Self Care | Payer: Medicare HMO | Source: Ambulatory Visit | Attending: Internal Medicine | Admitting: Internal Medicine

## 2019-01-23 DIAGNOSIS — N200 Calculus of kidney: Secondary | ICD-10-CM | POA: Diagnosis not present

## 2019-01-23 DIAGNOSIS — R791 Abnormal coagulation profile: Secondary | ICD-10-CM | POA: Diagnosis not present

## 2019-01-23 DIAGNOSIS — R1031 Right lower quadrant pain: Secondary | ICD-10-CM | POA: Diagnosis not present

## 2019-01-23 DIAGNOSIS — J90 Pleural effusion, not elsewhere classified: Secondary | ICD-10-CM | POA: Diagnosis not present

## 2019-01-23 DIAGNOSIS — D649 Anemia, unspecified: Secondary | ICD-10-CM | POA: Diagnosis not present

## 2019-01-23 DIAGNOSIS — I714 Abdominal aortic aneurysm, without rupture: Secondary | ICD-10-CM | POA: Diagnosis not present

## 2019-01-23 DIAGNOSIS — R1013 Epigastric pain: Secondary | ICD-10-CM | POA: Diagnosis not present

## 2019-01-23 DIAGNOSIS — I252 Old myocardial infarction: Secondary | ICD-10-CM | POA: Diagnosis not present

## 2019-01-23 DIAGNOSIS — K4090 Unilateral inguinal hernia, without obstruction or gangrene, not specified as recurrent: Secondary | ICD-10-CM | POA: Diagnosis not present

## 2019-01-23 DIAGNOSIS — Z9049 Acquired absence of other specified parts of digestive tract: Secondary | ICD-10-CM | POA: Diagnosis not present

## 2019-01-23 DIAGNOSIS — K5730 Diverticulosis of large intestine without perforation or abscess without bleeding: Secondary | ICD-10-CM | POA: Diagnosis not present

## 2019-01-23 DIAGNOSIS — J849 Interstitial pulmonary disease, unspecified: Secondary | ICD-10-CM

## 2019-01-26 DIAGNOSIS — S61411A Laceration without foreign body of right hand, initial encounter: Secondary | ICD-10-CM | POA: Diagnosis not present

## 2019-01-26 DIAGNOSIS — R55 Syncope and collapse: Secondary | ICD-10-CM | POA: Diagnosis not present

## 2019-01-26 DIAGNOSIS — W228XXA Striking against or struck by other objects, initial encounter: Secondary | ICD-10-CM | POA: Diagnosis not present

## 2019-02-04 DIAGNOSIS — I714 Abdominal aortic aneurysm, without rupture: Secondary | ICD-10-CM | POA: Diagnosis not present

## 2019-02-04 DIAGNOSIS — K439 Ventral hernia without obstruction or gangrene: Secondary | ICD-10-CM | POA: Diagnosis not present

## 2019-02-18 ENCOUNTER — Telehealth: Payer: Self-pay

## 2019-02-18 DIAGNOSIS — R1033 Periumbilical pain: Secondary | ICD-10-CM | POA: Diagnosis not present

## 2019-02-18 DIAGNOSIS — I4891 Unspecified atrial fibrillation: Secondary | ICD-10-CM | POA: Diagnosis not present

## 2019-02-18 DIAGNOSIS — Z7901 Long term (current) use of anticoagulants: Secondary | ICD-10-CM | POA: Diagnosis not present

## 2019-02-18 DIAGNOSIS — I4892 Unspecified atrial flutter: Secondary | ICD-10-CM | POA: Diagnosis not present

## 2019-02-18 DIAGNOSIS — D500 Iron deficiency anemia secondary to blood loss (chronic): Secondary | ICD-10-CM | POA: Diagnosis not present

## 2019-02-18 NOTE — Telephone Encounter (Signed)
-----   Message from Spero Geralds, MD sent at 02/06/2019  5:34 PM EST ----- Regarding: appointment Can we please make sure patient has an appointment with me in January to discuss these results and treatment options? Thank you ----- Message ----- From: Interface, Rad Results In Sent: 01/23/2019   9:58 AM EST To: Spero Geralds, MD

## 2019-02-18 NOTE — Telephone Encounter (Signed)
Called patient but call went straight to VM. Left detailed message for patient to call back.

## 2019-02-24 DIAGNOSIS — R1033 Periumbilical pain: Secondary | ICD-10-CM | POA: Diagnosis not present

## 2019-02-24 DIAGNOSIS — I714 Abdominal aortic aneurysm, without rupture: Secondary | ICD-10-CM | POA: Diagnosis not present

## 2019-02-24 DIAGNOSIS — Z9049 Acquired absence of other specified parts of digestive tract: Secondary | ICD-10-CM | POA: Diagnosis not present

## 2019-02-26 DIAGNOSIS — R1084 Generalized abdominal pain: Secondary | ICD-10-CM | POA: Diagnosis not present

## 2019-03-04 NOTE — Telephone Encounter (Signed)
Letter has been sent to patient

## 2019-03-17 DIAGNOSIS — R1033 Periumbilical pain: Secondary | ICD-10-CM | POA: Diagnosis not present

## 2019-03-19 DIAGNOSIS — I4891 Unspecified atrial fibrillation: Secondary | ICD-10-CM | POA: Diagnosis not present

## 2019-03-19 DIAGNOSIS — I4892 Unspecified atrial flutter: Secondary | ICD-10-CM | POA: Diagnosis not present

## 2019-03-20 ENCOUNTER — Encounter: Payer: Self-pay | Admitting: Internal Medicine

## 2019-03-20 ENCOUNTER — Ambulatory Visit (INDEPENDENT_AMBULATORY_CARE_PROVIDER_SITE_OTHER): Payer: Medicare HMO | Admitting: Internal Medicine

## 2019-03-20 ENCOUNTER — Other Ambulatory Visit: Payer: Self-pay

## 2019-03-20 DIAGNOSIS — J84112 Idiopathic pulmonary fibrosis: Secondary | ICD-10-CM

## 2019-03-20 NOTE — Progress Notes (Signed)
Danny Nelson    [id number]    03/02/1929  Primary Care Physician:Nelson, Danny Maduro, MD Date of Appointment: 03/20/2019 Established Patient Visit  Chief complaint:   Chief Complaint  Patient presents with  . Follow-up    F/U after CT scan in December 2020. States he is still dealing with SOB, especially with exertion. Nonproductive. Received 1st COVID vaccine on 03/13/2019.     HPI: Dyspnea on exertion.    Interval Updates: Still feeling sob. No hospitalizations or ED visits.  Had a fall with some neuropathic pain.  This is improved on gabapentin.  No cough.  I have reviewed the patient's family social and past medical history and updated as appropriate.   Past Medical History:  Diagnosis Date  . Anticoagulant long-term use   . Anticoagulation goal of INR 2 to 3   . Atrial fibrillation (HCC)   . BPH (benign prostatic hyperplasia)   . CAD (coronary artery disease)   . Complication of anesthesia    for throat surgery, was nasallyngiven cocaine and had a reaction from this -tachycardia and vomiting , then for spinal for another surgery, had problems with voiding-nneded a catheter  . Dyspnea on exertion   . Dysrhythmia    A-Fib  . Hypertension   . Hypothyroidism   . Hypovitaminosis D   . Leg edema   . Osteoarthritis     Past Surgical History:  Procedure Laterality Date  . CHOLECYSTECTOMY    . CORONARY ANGIOPLASTY     2 cardiac stents  . left foot surgery  1989   for spurs  . left shoulder surgery     torn left shoulder ligament  . right rotator cuff     right shoulder  . TOTAL KNEE ARTHROPLASTY Right 02/28/2018   Procedure: RIGHT TOTAL KNEE ARTHROPLASTY;  Surgeon: Kathryne Hitch, MD;  Location: [facility];  Service: Orthopedics;  Laterality: Right;  . vocal cord surgery  2003    Family History  Problem Relation Age of Onset  . Lung disease Neg Hx     Social History   Occupational History  . Not on file  Tobacco Use  . Smoking status: Never  Smoker  . Smokeless tobacco: Never Used  Substance and Sexual Activity  . Alcohol use: Never  . Drug use: Never  . Sexual activity: Not on file    Review of systems: Constitutional: No fevers, chills, night sweats, or weight loss. CV: No chest pain, or palpitations. Resp: Short of breath  Physical Exam: There were no vitals taken for this visit.  Telephone visit   Data Reviewed: Imaging: I have personally reviewed the CT chest from December 2020 which demonstrates bibasilar fine fibrosis most likely secondary to UIP  PFTs:   Labs:  Immunization status: Immunization History  Administered Date(s) Administered  . Influenza, High Dose Seasonal PF 11/15/2017  . Influenza, Quadrivalent, Recombinant, Inj, Pf 12/05/2018  . Moderna SARS-COVID-2 Vaccination 03/12/2019    Assessment:  Probable UIP  Plan/Recommendations: Danny Nelson has probable idiopathic pulmonary fibrosis.  He continues to be short of breath.  He apparently moved to [facility] in December.  He does need pulmonary function testing as well as an ambulatory desaturation study.  He may be a candidate for Ofev.  I welcomed him to follow-up with [place] in [place], but at this point since he lives in [facility], I have asked him to ask his PCP for referral to a pulmonologist in the area.  We are happy to fax over records when he is established.   I spent 24 minutes on 03/20/2019 in care of this patient including face to face time and non-face to face time spent charting, review of outside records, and coordination of care.  Return to Care: No follow-ups on file.   Lenice Llamas, MD [facility] [place]

## 2019-07-16 ENCOUNTER — Telehealth: Payer: Self-pay | Admitting: Cardiology

## 2019-07-16 NOTE — Telephone Encounter (Signed)
Patient called stating he has moved to [facility] and has a new cardiologist. He states he will not be making any further appointments with our office.

## 2019-08-31 ENCOUNTER — Ambulatory Visit: Payer: Medicare HMO | Admitting: Cardiology

## 2019-11-04 IMAGING — CT CT ABD-PELV W/ CM
2 of 5 series · 14 of 46 positions shown, 16 images · IV contrast (iopamidol)
Comparison: None.

CLINICAL DATA: Severe right-sided abdominal pain for the past 4
days.

EXAM:
CT ABDOMEN AND PELVIS WITH CONTRAST
TECHNIQUE: Multidetector CT imaging of the abdomen and pelvis was performed
using the standard protocol following bolus administration of
intravenous contrast.
CONTRAST:  100mL [id number] IOPAMIDOL ([id number]) INJECTION 61%

[Series 2: abd pelvis 5.00 br40 s3 ax · axial · 0.70mm/px · z∈[+1149,+1564]mm · 11 of 95 slices shown, 13 images]
[im 6/95  soft-tissue]
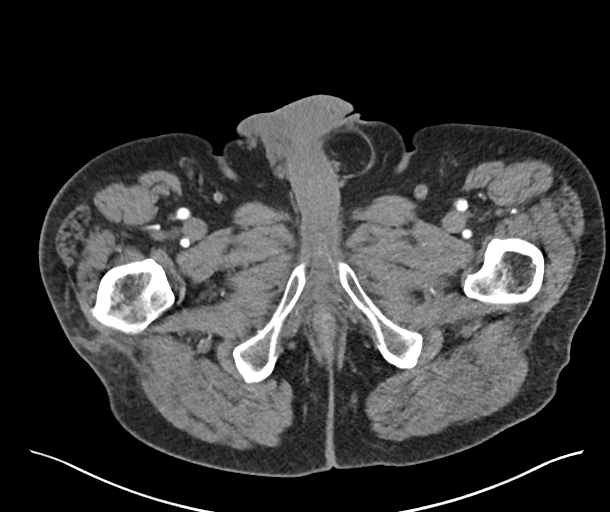
[im 6/95  bone]
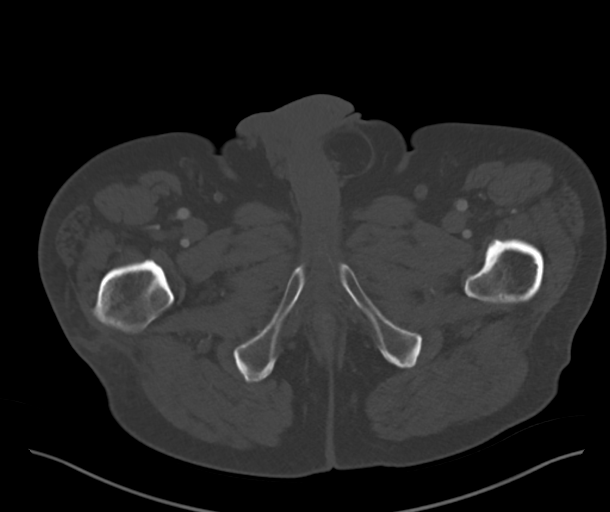
[im 16/95  soft-tissue]
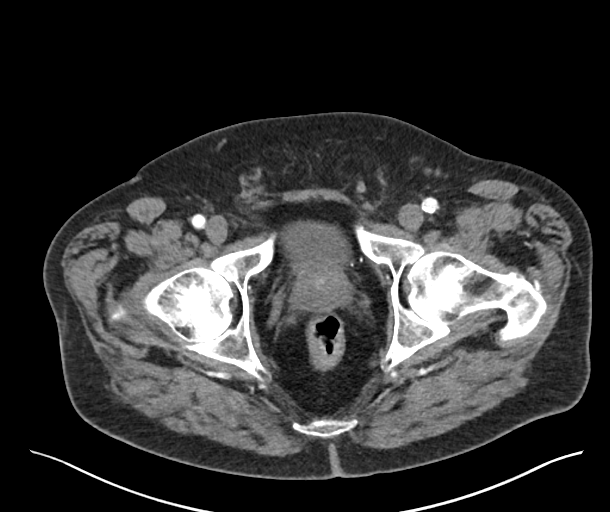
[im 21/95  soft-tissue]
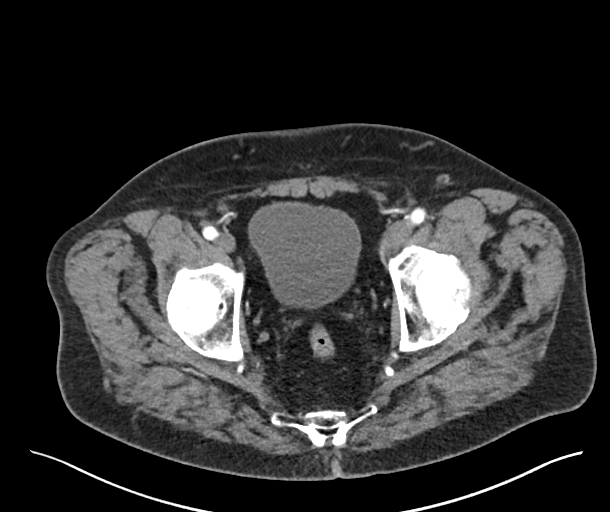
[im 32/95  soft-tissue]
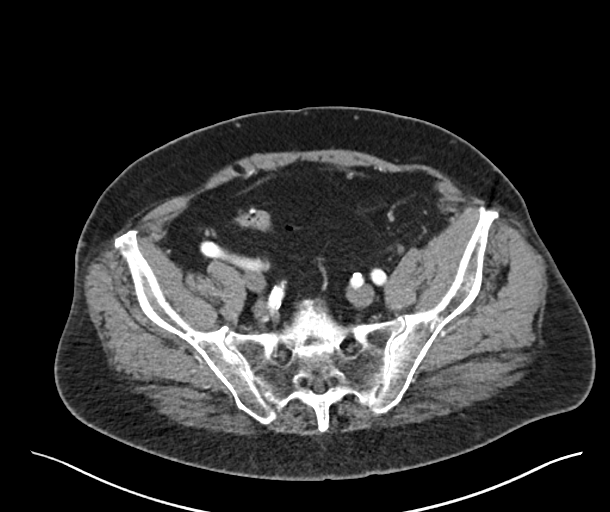
[im 37/95  soft-tissue]
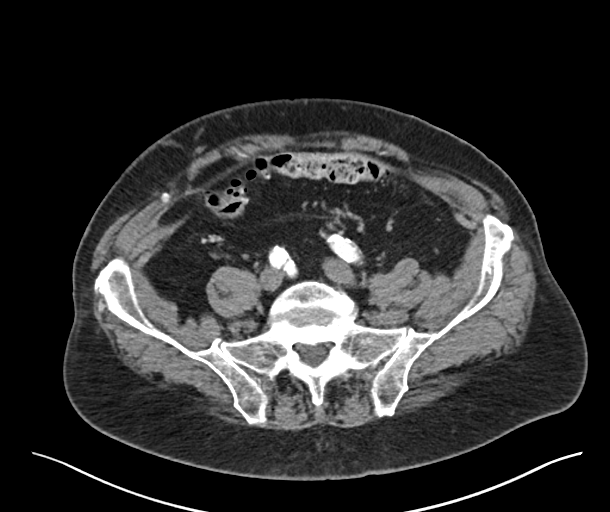
[im 48/95  soft-tissue]
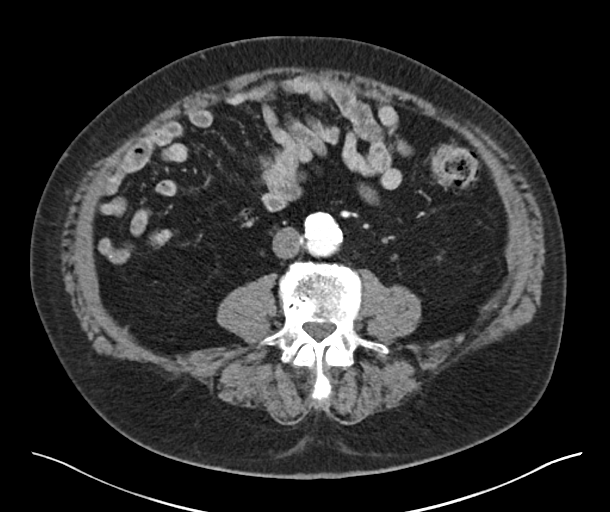
[im 58/95  soft-tissue]
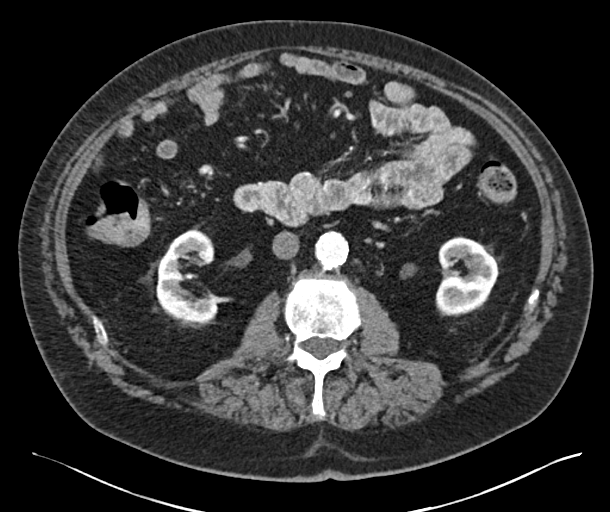
[im 63/95  soft-tissue]
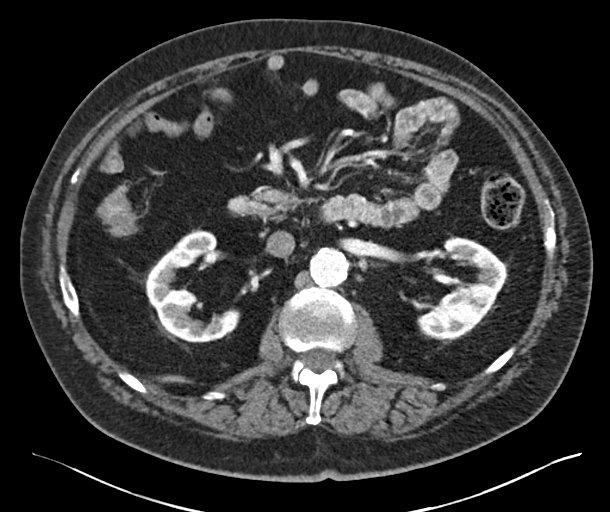
[im 74/95  soft-tissue]
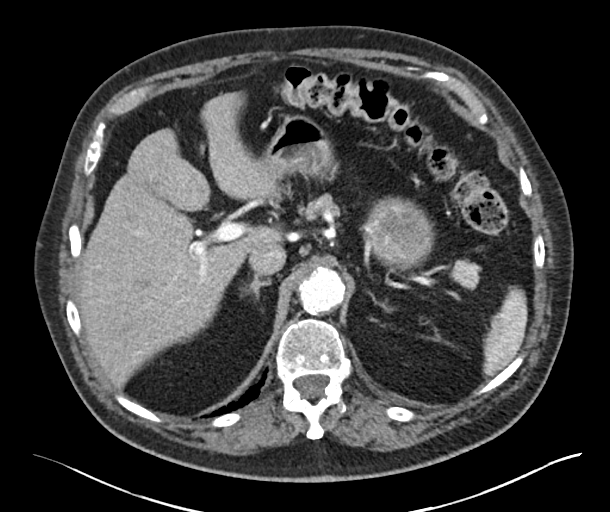
[im 74/95  bone]
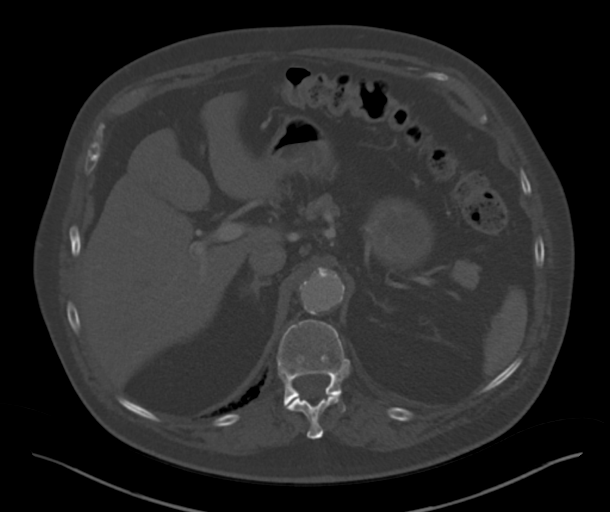
[im 79/95  soft-tissue]
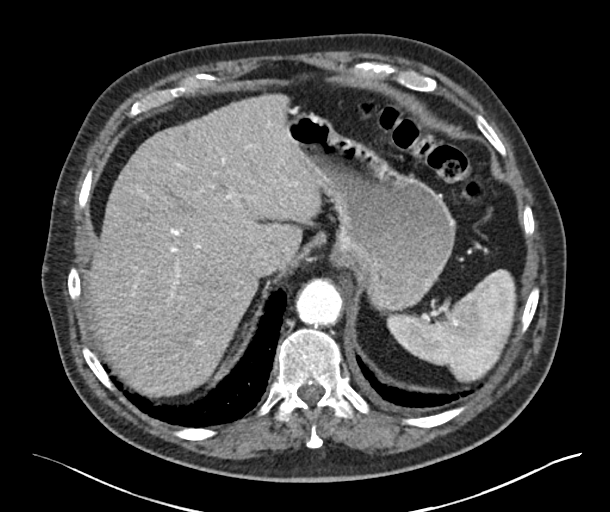
[im 89/95  soft-tissue]
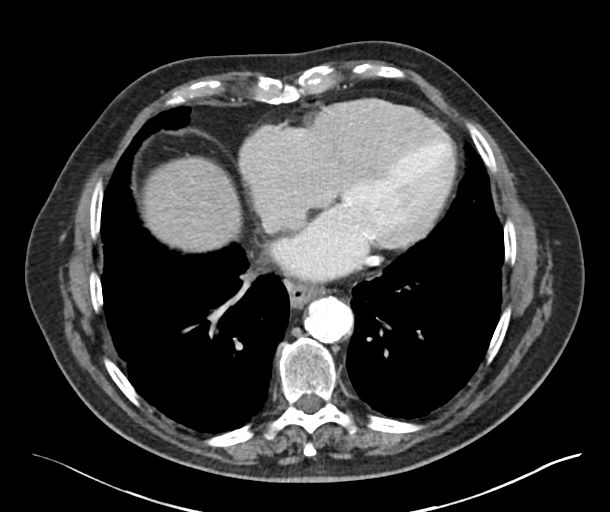

[Series 6: abd pelvis 2.00 br40 s3 cor · coronal · 0.84mm/px · 3 of 180 slices shown]
[im 60/180  soft-tissue]
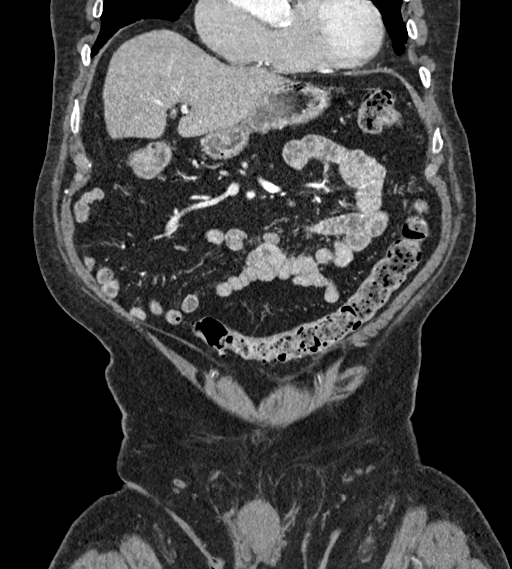
[im 80/180  soft-tissue]
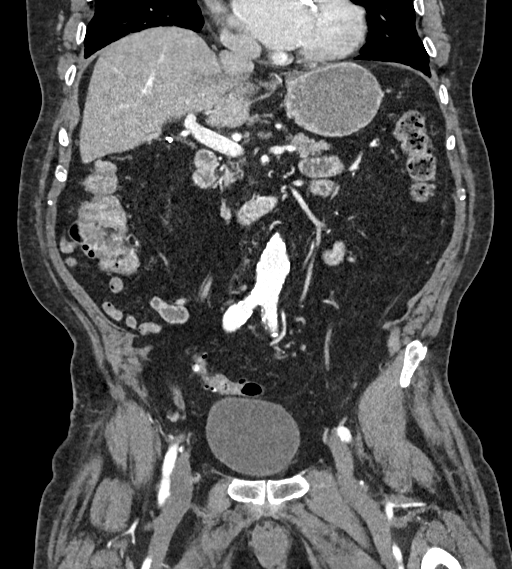
[im 100/180  soft-tissue]
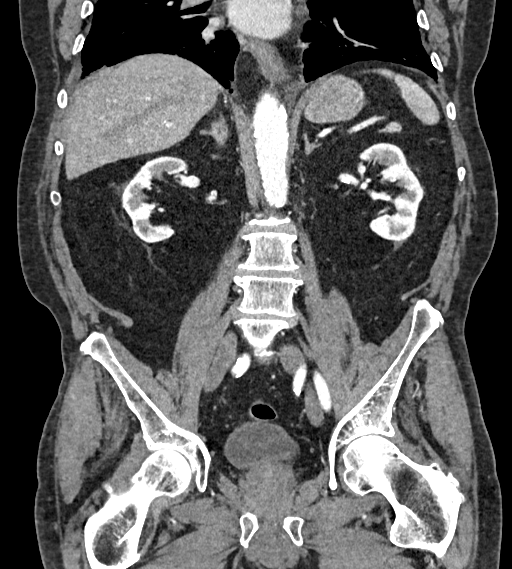

[14 of 46 positions shown; findings below may reference images not displayed]

FINDINGS: Lower chest: No acute abnormality. Mild age related fibrosis at the
lung bases. Mild cardiomegaly.

Hepatobiliary: No focal liver abnormality is seen. Status post
cholecystectomy. No biliary dilatation.

Pancreas: Unremarkable. No pancreatic ductal dilatation or
surrounding inflammatory changes.

Spleen: Normal in size without focal abnormality.

Adrenals/Urinary Tract: The adrenal glands are unremarkable.
Bilateral renal cortical thinning. No renal mass. 7 mm right renal
calculus. Left renovascular calcifications. No ureteral calculi or
hydronephrosis. The bladder is unremarkable.

Stomach/Bowel: Stomach is within normal limits. Appendix appears
normal. No evidence of bowel wall thickening, distention, or
inflammatory changes. Mild sigmoid diverticulosis.

Vascular/Lymphatic: Aortic atherosclerosis with aneurysmal
dilatation of the infrarenal abdominal aorta, measuring up to
cm. No enlarged abdominal or pelvic lymph nodes.

Reproductive: Prostate is unremarkable.

Other: Moderate fat containing left inguinal hernia. No free fluid
or pneumoperitoneum. There are two densely calcified peritoneal
nodules measuring up to 1.3 cm, likely postinflammatory.

Musculoskeletal: No acute or significant osseous findings.
IMPRESSION: 1.  No acute intra-abdominal process.
2. Infrarenal abdominal aortic aneurysm measuring up to 3.5 cm.
Recommend followup by ultrasound in 2 years. This recommendation
follows ACR consensus guidelines: White Paper of the ACR Incidental
Findings Committee II on Vascular Findings. [HOSPITAL] 4175;
[DATE].
3. Nonobstructive right nephrolithiasis.
4. Moderate fat containing left inguinal hernia.

These results will be called to the ordering clinician or
representative by the [HOSPITAL] at the imaging location.

## 2021-06-19 DEATH — deceased
